# Patient Record
Sex: Male | Born: 1950 | Race: White | Hispanic: No | Marital: Married
Health system: Midwestern US, Academic
[De-identification: ages and names within clinical notes are randomized; demographics above are authoritative.]

---

## 2017-05-04 ENCOUNTER — Encounter: Admit: 2017-05-04 | Discharge: 2017-05-04 | Payer: MEDICARE

## 2017-05-04 DIAGNOSIS — R079 Chest pain, unspecified: Principal | ICD-10-CM

## 2017-05-05 ENCOUNTER — Ambulatory Visit: Admit: 2017-05-05 | Discharge: 2017-05-06 | Payer: MEDICARE

## 2017-05-05 ENCOUNTER — Encounter: Admit: 2017-05-05 | Discharge: 2017-05-05 | Payer: MEDICARE

## 2017-05-05 DIAGNOSIS — I10 Essential (primary) hypertension: Principal | ICD-10-CM

## 2017-05-12 ENCOUNTER — Encounter: Admit: 2017-05-12 | Discharge: 2017-05-12 | Payer: MEDICARE

## 2017-05-12 DIAGNOSIS — R55 Syncope and collapse: Principal | ICD-10-CM

## 2017-05-12 DIAGNOSIS — I10 Essential (primary) hypertension: ICD-10-CM

## 2017-05-17 ENCOUNTER — Encounter: Admit: 2017-05-17 | Discharge: 2017-05-17 | Payer: MEDICARE

## 2017-05-27 ENCOUNTER — Encounter: Admit: 2017-05-27 | Discharge: 2017-05-27 | Payer: MEDICARE

## 2017-05-27 ENCOUNTER — Ambulatory Visit: Admit: 2017-05-27 | Discharge: 2017-05-28 | Payer: MEDICARE

## 2017-05-27 DIAGNOSIS — R9439 Abnormal result of other cardiovascular function study: ICD-10-CM

## 2017-05-27 DIAGNOSIS — I10 Essential (primary) hypertension: ICD-10-CM

## 2017-05-27 DIAGNOSIS — R079 Chest pain, unspecified: Principal | ICD-10-CM

## 2017-05-27 DIAGNOSIS — E6609 Other obesity due to excess calories: ICD-10-CM

## 2017-05-27 DIAGNOSIS — R0602 Shortness of breath: ICD-10-CM

## 2017-05-27 DIAGNOSIS — G4733 Obstructive sleep apnea (adult) (pediatric): ICD-10-CM

## 2017-05-27 DIAGNOSIS — R0600 Dyspnea, unspecified: Secondary | ICD-10-CM

## 2017-05-27 DIAGNOSIS — I4892 Unspecified atrial flutter: ICD-10-CM

## 2017-05-27 LAB — CBC
Lab: 12 — ABNORMAL HIGH (ref 0–14)
Lab: 16
Lab: 176
Lab: 30
Lab: 34
Lab: 49
Lab: 5.5
Lab: 8.8
Lab: 88

## 2017-05-27 LAB — BASIC METABOLIC PANEL: Lab: 140

## 2017-05-27 MED ORDER — LIDOCAINE (PF) 10 MG/ML (1 %) IJ SOLN
.1-2 mL | INTRAMUSCULAR | 0 refills | Status: CN | PRN
Start: 2017-05-27 — End: ?

## 2017-05-27 NOTE — Progress Notes
Medicare is listed as patient's primary insurance coverage.  Pre-certification is not required for hospitalizations.

## 2017-05-27 NOTE — Progress Notes
Date of Service: 05/27/2017    Dylan Cox is a 66 y.o. male.       HPI     Mr. think there is a 66 year old white male, he is here today to establish care with [facility].  This patient has been experiencing symptoms of chest pain, shortness of breath and symptomatic heart palpitations that mainly occur with physical activity, they have been intensifying for the past few weeks/few months.    He was evaluated with a myocardial perfusion imaging study on 05/06/2017, the study was interpreted to be mildly abnormal due to basal inferior septal hypokinesis and an ejection fraction of 49%.  The tomographic pattern did not identify any significant myocardial ischemia in the ECG portion of the study was negative for ischemia.    The patient was also evaluated with a 2D echo Doppler study on May 04, 2017 he demonstrated normal left ventricular systolic function, mild concentric LVH, mild mitral vascular sclerosis, no pericardial effusion was identified.    This patient was also evaluated with a Holter monitor performed in [facility], the rhythm was normal sinus without any arrhythmia.    The laboratory work dated 04/22/2017 was remarkable for normal liver enzymes, HDL was 41 mg/dL, LDL was 161 mg/dL, hemoglobin W9U was normal at 5.8%.    Patient's past medical history is significant for sleep apnea, he currently uses CPAP.    A cholesterol profile dated 04/22/2017 demonstrated a total cholesterol of 179 mg/dL, triglycerides 045 mg/dL, HDL 41 mg/dL, LDL of 409 mg/dL.     Social history: This patient does have a history of smoking but he quit tobacco use approximately 15 years ago.  He rarely drinks alcohol.  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    Family history: It is significant for premature coronary artery disease, his father had a stroke and a myocardial infarction before age 37 and a brother also had a myocardial infarction and has carotid artery disease.         Vitals:    05/27/17 1300   BP: 148/88   Pulse: 75   Weight: 128.9 kg (284 lb 3.2 oz)   Height: 1.829 m (6')     Body mass index is 38.54 kg/m???.     Past Medical History  Patient Active Problem List    Diagnosis Date Noted   ??? Shortness of breath 05/17/2017   ??? Hypertension 05/17/2017   ??? Chest pain 05/17/2017     1. Holter - 05/05/17 at [facility] - 1. Normal 24 hour holter revealing NSR and no arrhythmias.       ??? Atrial flutter (HCC) 05/17/2017         Review of Systems   Constitution: Positive for malaise/fatigue.   HENT: Positive for hearing loss and tinnitus.    Eyes: Positive for visual disturbance.   Cardiovascular: Positive for chest pain, dyspnea on exertion and leg swelling.   Respiratory: Positive for shortness of breath, sleep disturbances due to breathing and wheezing.    Endocrine: Negative.    Hematologic/Lymphatic: Negative.    Skin: Negative.    Musculoskeletal: Positive for arthritis.   Gastrointestinal: Negative.    Genitourinary: Positive for incomplete emptying.   Neurological: Negative.    Psychiatric/Behavioral: Negative.    Allergic/Immunologic: Negative.        Physical Exam  General Appearance: normal in appearance  Skin: warm, moist, no ulcers or  xanthomas  Eyes: conjunctivae and lids normal, pupils are equal and round  Lips & Oral Mucosa: no pallor or cyanosis  Neck Veins: neck veins are flat, neck veins are not distended  Chest Inspection: chest is normal in appearance  Respiratory Effort: breathing comfortably, no respiratory distress  Auscultation/Percussion: lungs clear to auscultation, no rales or rhonchi, no wheezing  Cardiac Rhythm: regular rhythm and normal rate  Cardiac Auscultation: S1, S2 normal, no rub, no gallop  Murmurs: no murmur  Carotid Arteries: normal carotid upstroke bilaterally, no bruit  Lower Extremity Edema: no lower extremity edema Abdominal Exam: soft, non-tender, no masses, bowel sounds normal  Liver & Spleen: no organomegaly  Language and Memory: patient responsive and seems to comprehend information  Neurologic Exam: neurological assessment grossly intact        Cardiovascular Studies  Twelve-lead EKG demonstrated normal sinus rhythm, no ST segment T-wave changes    Problems Addressed Today  Encounter Diagnoses   Name Primary?   ??? Chest pain, unspecified type Yes   ??? Shortness of breath    ??? Abnormal thallium stress test    ??? Atrial flutter, unspecified type (HCC)    ??? Essential hypertension    ??? Class 2 obesity due to excess calories without serious comorbidity with body mass index (BMI) of 38.0 to 38.9 in adult        Assessment and Plan     Assessment:    1.  Symptoms of chest pain, shortness of breath and heart palpitations  ??? These typically occur with physical activity and they could be reflective of underlying coronary artery disease    2.  Mildly abnormal myocardial perfusion imaging study dated 05/06/2017  ??? There were wall motion abnormalities compatible with inferior septal hypokinesis, the ejection fraction was 49%  ??? The tomographic pattern did not demonstrate any large areas of jeopardized ischemic myocardium    3.  Obesity  ??? Patient's BMI is 38.54    4.  Hypertension  ??? He is currently treated with carvedilol and a diuretic    5.  Hyperlipidemia  ??? Patient is on simvastatin     6.  Framingham risk score is high more than 30% estimated 10 year global CVD risk  ??? According to the is assessment the estimated vascular age she is more than 80 years    Plan:    1.  In the light of patient's symptoms, risk factors for coronary artery disease, high Framingham risk score and abnormal myocardial perfusion imaging study we will proceed with a left heart catheterization for further definition of the coronary anatomy.  2.  I also recommended a low-fat low-cholesterol, low sugar,  low carbohydrate diet and further risk factors modification.  3.  Follow-up office visit in 4-6 weeks following the procedure.  4.  In the light of high Framingham score I also recommend to aim for an LDL of less than 100 mg/dL, will call the patient and asked him to increase the simvastatin to 40 mg p.o. daily.         Current Medications (including today's revisions)  ??? albuterol (PROAIR HFA, VENTOLIN HFA, OR PROVENTIL HFA) 90 mcg/actuation inhaler Inhale 2 puffs by mouth into the lungs every 6 hours as needed for Wheezing or Shortness of Breath. Shake well before use.   ??? aspirin EC 325 mg tablet Take 325 mg by mouth daily. Take with food.   ??? carvedilol (COREG) 6.25 mg tablet Take 6.25 mg by mouth twice daily with meals.  Take with food.   ??? diclofenac sodium 2 % sopk Apply  topically to affected area.   ??? gabapentin (NEURONTIN) 300 mg capsule Take 300 mg by mouth every 8 hours.   ??? meloxicam (MOBIC) 15 mg tablet Take 15 mg by mouth daily.   ??? omeprazole DR(+) (PRILOSEC) 20 mg capsule Take 20 mg by mouth daily before breakfast.   ??? simvastatin (ZOCOR) 20 mg tablet Take 20 mg by mouth at bedtime daily.   ??? triamterene-hydrochlorothiazide (DYAZIDE) 37.5-25 mg capsule Take 1 capsule by mouth every morning.

## 2017-05-28 ENCOUNTER — Encounter: Admit: 2017-05-28 | Discharge: 2017-05-28 | Payer: MEDICARE

## 2017-05-28 DIAGNOSIS — R079 Chest pain, unspecified: Principal | ICD-10-CM

## 2017-05-28 DIAGNOSIS — R9439 Abnormal result of other cardiovascular function study: Principal | ICD-10-CM

## 2017-05-28 DIAGNOSIS — R0602 Shortness of breath: ICD-10-CM

## 2017-05-28 MED ORDER — TEMAZEPAM 15 MG PO CAP
15 mg | Freq: Every evening | ORAL | 0 refills | Status: CN | PRN
Start: 2017-05-28 — End: ?

## 2017-05-28 MED ORDER — ALUMINUM-MAGNESIUM HYDROXIDE 200-200 MG/5 ML PO SUSP
30 mL | ORAL | 0 refills | Status: CN | PRN
Start: 2017-05-28 — End: ?

## 2017-05-28 MED ORDER — ACETAMINOPHEN 325 MG PO TAB
650 mg | ORAL | 0 refills | Status: CN | PRN
Start: 2017-05-28 — End: ?

## 2017-05-28 MED ORDER — MAGNESIUM HYDROXIDE 2,400 MG/10 ML PO SUSP
10 mL | ORAL | 0 refills | Status: CN | PRN
Start: 2017-05-28 — End: ?

## 2017-05-28 MED ORDER — SODIUM CHLORIDE 0.9 % IV SOLP
INTRAVENOUS | 0 refills | Status: CN
Start: 2017-05-28 — End: ?

## 2017-05-28 NOTE — H&P (View-Only)
Patient presents for procedure. Please see most recent H/P May 27, 2017 below.     Elinor Parkinson, APRN-C  Pager [phone]     _____________________________________________________________________________    Office Visit     05/27/2017  [facility] at [facility], [place]   Cardiology   Chest pain, unspecified type +6 more   Dx   Cardiac Eval , Hypertension; Referred by Lona Kettle, MD   Reason for Visit    Progress Notes   Nickolas Madrid, MD (Physician) ??? ??? Cardiology ??? ??? 05/27/17 1315 ??? ??? Signed      Date of Service: 05/27/2017  ???  Dylan Cox is a 66 y.o. male.     ???  HPI  Mr. think there is a 66 year old white male, he is here today to establish care with [facility].  This patient has been experiencing symptoms of chest pain, shortness of breath and symptomatic heart palpitations that mainly occur with physical activity, they have been intensifying for the past few weeks/few months.  ???  He was evaluated with a myocardial perfusion imaging study on 05/06/2017, the study was interpreted to be mildly abnormal due to basal inferior septal hypokinesis and an ejection fraction of 49%.  The tomographic pattern did not identify any significant myocardial ischemia in the ECG portion of the study was negative for ischemia.  ???  The patient was also evaluated with a 2D echo Doppler study on May 04, 2017 he demonstrated normal left ventricular systolic function, mild concentric LVH, mild mitral vascular sclerosis, no pericardial effusion was identified.  ???  This patient was also evaluated with a Holter monitor performed in [facility], the rhythm was normal sinus without any arrhythmia.  ???  The laboratory work dated 04/22/2017 was remarkable for normal liver enzymes, HDL was 41 mg/dL, LDL was 960 mg/dL, hemoglobin A5W was normal at 5.8%.  ???  Patient's past medical history is significant for sleep apnea, he currently uses CPAP.  ??? A cholesterol profile dated 04/22/2017 demonstrated a total cholesterol of 179 mg/dL, triglycerides 098 mg/dL, HDL 41 mg/dL, LDL of 119 mg/dL.   ???  Social history: This patient does have a history of smoking but he quit tobacco use approximately 15 years ago.  He rarely drinks alcohol.  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  ???  Family history: It is significant for premature coronary artery disease, his father had a stroke and a myocardial infarction before age 47 and a brother also had a myocardial infarction and has carotid artery disease.  ???  ???  Vitals:   ??? 05/27/17 1300   BP: 148/88   Pulse: 75   Weight: 128.9 kg (284 lb 3.2 oz)   Height: 1.829 m (6')   ???  Body mass index is 38.54 kg/m???.   ???  Past Medical History        Patient Active Problem List   ??? Diagnosis Date Noted   ??? Shortness of breath 05/17/2017   ??? Hypertension 05/17/2017   ??? Chest pain 05/17/2017   ??? ??? 1. Holter - 05/05/17 at [facility] - 1. Normal 24 hour holter revealing NSR and no arrhythmias.    ???   ??? Atrial flutter (HCC) 05/17/2017   ???  ???  No Known Allergies  Family History   Problem Relation Age of Onset   ??? Heart Attack Father    ???  Stroke Father    ??? Heart Attack Brother      No past surgical history on file.  Social History     Social History   ??? Marital status: Married     Spouse name: N/A   ??? Number of children: N/A   ??? Years of education: N/A     Social History Main Topics   ??? Smoking status: Former Smoker     Quit date: 05/27/2002   ??? Smokeless tobacco: Never Used   ??? Alcohol use Yes      Comment: Occasional   ??? Drug use: No   ??? Sexual activity: Not on file     Other Topics Concern   ??? Not on file     Social History Narrative   ??? No narrative on file       ???  Review of Systems   Constitution: Positive for malaise/fatigue.   HENT: Positive for hearing loss and tinnitus.    Eyes: Positive for visual disturbance. Cardiovascular: Positive for chest pain, dyspnea on exertion and leg swelling.   Respiratory: Positive for shortness of breath, sleep disturbances due to breathing and wheezing.    Endocrine: Negative.    Hematologic/Lymphatic: Negative.    Skin: Negative.    Musculoskeletal: Positive for arthritis.   Gastrointestinal: Negative.    Genitourinary: Positive for incomplete emptying.   Neurological: Negative.    Psychiatric/Behavioral: Negative.    Allergic/Immunologic: Negative.    ???  ???  Physical Exam  General Appearance: normal in appearance  Skin: warm, moist, no ulcers or xanthomas  Eyes: conjunctivae and lids normal, pupils are equal and round  Lips & Oral Mucosa: no pallor or cyanosis  Neck Veins: neck veins are flat, neck veins are not distended  Chest Inspection: chest is normal in appearance  Respiratory Effort: breathing comfortably, no respiratory distress  Auscultation/Percussion: lungs clear to auscultation, no rales or rhonchi, no wheezing  Cardiac Rhythm: regular rhythm and normal rate  Cardiac Auscultation: S1, S2 normal, no rub, no gallop  Murmurs: no murmur  Carotid Arteries: normal carotid upstroke bilaterally, no bruit  Lower Extremity Edema: no lower extremity edema  Abdominal Exam: soft, non-tender, no masses, bowel sounds normal  Liver & Spleen: no organomegaly  Language and Memory: patient responsive and seems to comprehend information  Neurologic Exam: neurological assessment grossly intact  ???  ???  ???  Cardiovascular Studies  Twelve-lead EKG demonstrated normal sinus rhythm, no ST segment T-wave changes  ???  Problems Addressed Today       Encounter Diagnoses   Name Primary?   ??? Chest pain, unspecified type Yes   ??? Shortness of breath ???   ??? Abnormal thallium stress test ???   ??? Atrial flutter, unspecified type (HCC) ???   ??? Essential hypertension ???   ??? Class 2 obesity due to excess calories without serious comorbidity with body mass index (BMI) of 38.0 to 38.9 in adult ???   ???  ???  Assessment and Plan Assessment:  ???  1.  Symptoms of chest pain, shortness of breath and heart palpitations  ??? These typically occur with physical activity and they could be reflective of underlying coronary artery disease  ???  2.  Mildly abnormal myocardial perfusion imaging study dated 05/06/2017  ??? There were wall motion abnormalities compatible with inferior septal hypokinesis, the ejection fraction was 49%  ??? The tomographic pattern did not demonstrate any large areas of jeopardized ischemic myocardium  ???  3.  Obesity  ???  Patient's BMI is 38.54  ???  4.  Hypertension  ??? He is currently treated with carvedilol and a diuretic  ???  5.  Hyperlipidemia  ??? Patient is on simvastatin   ???  6.  Framingham risk score is high more than 30% estimated 10 year global CVD risk  ??? According to the is assessment the estimated vascular age she is more than 80 years  ???  Plan:  ???  1.  In the light of patient's symptoms, risk factors for coronary artery disease, high Framingham risk score and abnormal myocardial perfusion imaging study we will proceed with a left heart catheterization for further definition of the coronary anatomy.  2.  I also recommended a low-fat low-cholesterol, low sugar,  low carbohydrate diet and further risk factors modification.  3.  Follow-up office visit in 4-6 weeks following the procedure.  4.  In the light of high Framingham score I also recommend to aim for an LDL of less than 100 mg/dL, will call the patient and asked him to increase the simvastatin to 40 mg p.o. daily.  ???  ???  Current Medications (including today's revisions)  ??? albuterol (PROAIR HFA, VENTOLIN HFA, OR PROVENTIL HFA) 90 mcg/actuation inhaler Inhale 2 puffs by mouth into the lungs every 6 hours as needed for Wheezing or Shortness of Breath. Shake well before use.   ??? aspirin EC 325 mg tablet Take 325 mg by mouth daily. Take with food.   ??? carvedilol (COREG) 6.25 mg tablet Take 6.25 mg by mouth twice daily with meals. Take with food. ??? diclofenac sodium 2 % sopk Apply  topically to affected area.   ??? gabapentin (NEURONTIN) 300 mg capsule Take 300 mg by mouth every 8 hours.   ??? meloxicam (MOBIC) 15 mg tablet Take 15 mg by mouth daily.   ??? omeprazole DR(+) (PRILOSEC) 20 mg capsule Take 20 mg by mouth daily before breakfast.   ??? simvastatin (ZOCOR) 20 mg tablet Take 20 mg by mouth at bedtime daily.   ??? triamterene-hydrochlorothiazide (DYAZIDE) 37.5-25 mg capsule Take 1 capsule by mouth every morning.   ???

## 2017-06-01 ENCOUNTER — Encounter: Admit: 2017-06-01 | Discharge: 2017-06-01 | Payer: MEDICARE

## 2017-06-01 MED ORDER — SIMVASTATIN 40 MG PO TAB
40 mg | ORAL_TABLET | Freq: Every evening | ORAL | 3 refills | Status: AC
Start: 2017-06-01 — End: 2018-05-23

## 2017-06-02 ENCOUNTER — Encounter: Admit: 2017-06-02 | Discharge: 2017-06-02 | Payer: MEDICARE

## 2017-06-02 DIAGNOSIS — R079 Chest pain, unspecified: Principal | ICD-10-CM

## 2017-06-02 DIAGNOSIS — R0600 Dyspnea, unspecified: ICD-10-CM

## 2017-06-02 DIAGNOSIS — G4733 Obstructive sleep apnea (adult) (pediatric): ICD-10-CM

## 2017-06-02 DIAGNOSIS — I4892 Unspecified atrial flutter: ICD-10-CM

## 2017-06-02 DIAGNOSIS — I25119 Atherosclerotic heart disease of native coronary artery with unspecified angina pectoris: ICD-10-CM

## 2017-06-02 DIAGNOSIS — E6609 Other obesity due to excess calories: ICD-10-CM

## 2017-06-02 DIAGNOSIS — I10 Essential (primary) hypertension: ICD-10-CM

## 2017-06-02 DIAGNOSIS — R9439 Abnormal result of other cardiovascular function study: ICD-10-CM

## 2017-06-02 MED ORDER — TRIAMTERENE-HYDROCHLOROTHIAZID 37.5-25 MG PO TAB
1 | Freq: Every day | ORAL | 0 refills | Status: DC
Start: 2017-06-02 — End: 2017-06-03

## 2017-06-02 MED ORDER — SODIUM CHLORIDE 0.9 % IV SOLP
INTRAVENOUS | 0 refills | Status: DC
Start: 2017-06-02 — End: 2017-06-03
  Administered 2017-06-02: 15:00:00 1000.000 mL via INTRAVENOUS

## 2017-06-02 MED ORDER — ALBUTEROL SULFATE 90 MCG/ACTUATION IN HFAA
2 | RESPIRATORY_TRACT | 0 refills | Status: DC | PRN
Start: 2017-06-02 — End: 2017-06-03

## 2017-06-02 MED ORDER — ATORVASTATIN 40 MG PO TAB
40 mg | Freq: Every evening | ORAL | 0 refills | Status: DC
Start: 2017-06-02 — End: 2017-06-03
  Administered 2017-06-03: 01:00:00 40 mg via ORAL

## 2017-06-02 MED ORDER — DIPHENHYDRAMINE HCL 25 MG PO CAP
25 mg | ORAL | 0 refills | Status: DC | PRN
Start: 2017-06-02 — End: 2017-06-03

## 2017-06-02 MED ORDER — GABAPENTIN 300 MG PO CAP
300 mg | ORAL | 0 refills | Status: DC
Start: 2017-06-02 — End: 2017-06-03
  Administered 2017-06-02 – 2017-06-03 (×2): 300 mg via ORAL

## 2017-06-02 MED ORDER — MAGNESIUM HYDROXIDE 2,400 MG/10 ML PO SUSP
10 mL | ORAL | 0 refills | Status: DC | PRN
Start: 2017-06-02 — End: 2017-06-03

## 2017-06-02 MED ORDER — PANTOPRAZOLE 40 MG PO TBEC
40 mg | Freq: Every day | ORAL | 0 refills | Status: DC
Start: 2017-06-02 — End: 2017-06-03

## 2017-06-02 MED ORDER — ACETAMINOPHEN 325 MG PO TAB
650 mg | ORAL | 0 refills | Status: DC | PRN
Start: 2017-06-02 — End: 2017-06-03
  Administered 2017-06-03 (×2): 650 mg via ORAL

## 2017-06-02 MED ORDER — CLOPIDOGREL 300 MG PO TAB
600 mg | Freq: Once | ORAL | 0 refills | Status: DC
Start: 2017-06-02 — End: 2017-06-03

## 2017-06-02 MED ORDER — DIPHENHYDRAMINE HCL 50 MG/ML IJ SOLN
25 mg | INTRAVENOUS | 0 refills | Status: DC | PRN
Start: 2017-06-02 — End: 2017-06-03

## 2017-06-02 MED ORDER — ASPIRIN 81 MG PO TBEC
81 mg | Freq: Every day | ORAL | 0 refills | Status: DC
Start: 2017-06-02 — End: 2017-06-03

## 2017-06-02 MED ORDER — TEMAZEPAM 15 MG PO CAP
15 mg | Freq: Every evening | ORAL | 0 refills | Status: DC | PRN
Start: 2017-06-02 — End: 2017-06-03

## 2017-06-02 MED ORDER — CLOPIDOGREL 75 MG PO TAB
75 mg | Freq: Every day | ORAL | 0 refills | Status: DC
Start: 2017-06-02 — End: 2017-06-03
  Administered 2017-06-03: 13:00:00 75 mg via ORAL

## 2017-06-02 MED ORDER — ALUMINUM-MAGNESIUM HYDROXIDE 200-200 MG/5 ML PO SUSP
30 mL | ORAL | 0 refills | Status: DC | PRN
Start: 2017-06-02 — End: 2017-06-03

## 2017-06-02 MED ORDER — CARVEDILOL 6.25 MG PO TAB
6.25 mg | Freq: Two times a day (BID) | ORAL | 0 refills | Status: DC
Start: 2017-06-02 — End: 2017-06-03
  Administered 2017-06-03: 01:00:00 6.25 mg via ORAL

## 2017-06-02 MED ORDER — LIDOCAINE (PF) 10 MG/ML (1 %) IJ SOLN
.1-2 mL | INTRAMUSCULAR | 0 refills | Status: DC | PRN
Start: 2017-06-02 — End: 2017-06-03

## 2017-06-02 MED ORDER — ONDANSETRON HCL (PF) 4 MG/2 ML IJ SOLN
4 mg | INTRAVENOUS | 0 refills | Status: DC | PRN
Start: 2017-06-02 — End: 2017-06-03

## 2017-06-02 MED ORDER — SIMVASTATIN 40 MG PO TAB
40 mg | Freq: Every evening | ORAL | 0 refills | Status: CN
Start: 2017-06-02 — End: ?

## 2017-06-02 NOTE — Progress Notes
Patient arrived on unit via ambulation accompanied by family. Patient transferred to the bed with assistance. Frailty score equals 3.  Assessment completed, refer to flowsheet for details. Orders released, reviewed, and implemented as appropriate. Oriented to surroundings, call light within reach. Plan of care reviewed.  Will continue to monitor and assess.

## 2017-06-02 NOTE — Progress Notes
RESPIRATORY THERAPY  ADULT PROTOCOL EVALUATION      RESPIRATORY PROTOCOL PLAN    Medications  Albuterol: MDI PRN    Note: If indicated by protocol, medication orders will be placed by therapist.    Procedures          _____________________________________________________________    PATIENT EVALUATION RESULTS    Chart Review  * Pulmonary Hx: Smoking cessation < 8 weeks OR still smoking OR > 20 pack/yr hx (PEFR)_____ OR occasional use of bronchodilator (AM)    * Surgical Hx: General surgery (cough & sigh not affected)    * Chest X-Ray: Clear OR not available    * PFT/Oxygenation: FEV1, PEFR > 80% predicted OR physically unable to perform OR Pa02 >80 RA OR Sp02 >95% RA      Patient Assessment  * Respiratory Pattern: Regular pattern and rate OR good chest excursion with deep breathing    * Breath Sounds: Clear and able to auscultate bases posteriorly    * Cough / Sputum: Strong, effective cough OR nonproductive    * Mental Status: Alert, oriented, cooperative    * Activity Level: Ambulatory      Priority Index  Total Points: 3 Points  * Priority Index: 1+    PRIORITY INDEX GUIDELINES*  Priority Points   1 0-9 points   2 9-18 points   3 > 18 points   + Pulm Dx or Home Rx   *Higher points indicate higher acuity.      Therapist: Drucilla ChaletPaula Jakalyn Kratky, RT  Date: 06/02/2017      Key  AC = Airway clearance  AM = Aerosolized medication  BA = Bland aerosol  DB&C = Deep breathe & cough  FEV1 = Forced expiratory volume in first second)  IC = Inspiratory capacity  LE = Lung expansion  MDI = Metered dose inhaler  Neb = Nebulizer  O2 = Oxygen  Oxim =Oximetry  PEFR = Peak expiratory flow rate  RRT = Rapid Response Team

## 2017-06-02 NOTE — Discharge Instructions - Pharmacy
Physician Discharge Summary      Name: Dylan Cox  Medical Record Number: [id number]        Account Number:  [id number]  Date Of Birth:  Mar 31, 1951                         Age:  66 years   Admit date:  06/02/2017                     Discharge date:  06/03/2017    Attending Physician:  Dylan Alto, MD              Service: Cardiology-Interventional    Physician Summary completed by: Sharlynn Oliphant, APRN    Reason for hospitalization: chest pain,shortness of breath, palpitations, abnormal stress test > planned cardiac cath     Significant PMH:   Past Medical History:   Diagnosis Date   ??? Abnormal thallium stress test 05/27/2017    Added automatically from request for surgery [id number]   ??? Atrial flutter (HCC) 05/17/2017   ??? Chest pain 05/17/2017    1. Holter - 05/05/17 at [facility] - 1. Normal 24 hour holter revealing NSR and no arrhythmias.     ??? Class 2 obesity due to excess calories without serious comorbidity with body mass index (BMI) of 38.0 to 38.9 in adult 05/27/2017   ??? Coronary artery disease involving native coronary artery of native heart with angina pectoris (HCC) 06/02/2017    06/02/2017 - mildly abnormal stress test. presented for cath. DES x1 to 80-90% Ramus and DES x 1 to 80-90% PDA    ??? Dyspnea 05/27/2017    Added automatically from request for surgery [id number]   ??? Hypertension 05/17/2017   ??? Obstructive sleep apnea syndrome 05/27/2017         Allergies: Patient has no known allergies.    Admission Lab/Radiology studies notable for:   Hematology:    Lab Results   Component Value Date    HGB 15.3 06/03/2017    HCT 45.8 06/03/2017    PLTCT 168 06/03/2017    WBC 7.6 06/03/2017    MCV 88.6 06/03/2017    MCHC 33.5 06/03/2017    MPV 9.4 06/03/2017    RDW 13.8 06/03/2017   , Coagulation:  No results found for: PT, PTT, INR, General Chemistry:    Lab Results   Component Value Date    NA 139 06/03/2017    K 3.6 06/03/2017    CL 105 06/03/2017    GAP 7 06/03/2017    BUN 18 06/03/2017    CR 0.91 06/03/2017 GLU 102 06/03/2017    CA 9.4 06/03/2017    ALBUMIN 3.8 04/22/2017    MG 2.2 04/22/2017    TOTBILI 0.70 04/22/2017   , Enzymes:    Lab Results   Component Value Date    AST 28 04/22/2017    ALT 35 04/22/2017    ALKPHOS 54 04/22/2017    and Lipid Profile:   Lab Results   Component Value Date    CHOL 112 06/03/2017    TRIG 132 06/03/2017    HDL 35 06/03/2017    LDL 57 06/03/2017    VLDL 26 06/03/2017         Brief Hospital Course:   [name] is a 66 year old male with a h/o OSA and using CPAP. He was seen in clinic with complaints of chest pain, palpitations, and shortness of breath.  He was evaluated with a myocardial perfusion imaging study on 05/06/2017, the study was interpreted to be mildly abnormal due to basal inferior septal hypokinesis and an ejection fraction of 49%.  The tomographic pattern did not identify any significant myocardial ischemia in the ECG portion of the study was negative for ischemia. He was also evaluated with a 2D echo Doppler study on May 04, 2017 he demonstrated normal left ventricular systolic function, mild concentric LVH, mild mitral vascular sclerosis, no pericardial effusion was identified. Holter monitor was negative for arrhythmias.     In light of his symptoms and high Framingham risk score as well as abnormal MPI he was referred for cath. His cath showed  vessel coronary atherosclerosis manifested by an 80% to 90% mid intermediate ramus vessel stenosis status post PCI with a 2.25 x 12 Xience drug-eluting stent, and an 80% mid posterior descending artery stenosis status post PCI with a 2.25 x 12 Xience drug-eluting stent. He had 20% to 30% irregularities throughout the mid LAD. He was initiated on Plavix 75 mg daily which he will continue uninterrupted for a minimum of one year. He will remain on ASA 81 mg EC daily for life.       His post procedure course was uncomplicated and he was discharged home in stable condition the following morning. He will follow up with Dr. Avie Arenas in about 6-8 weeks. Upon discharge the patient and family were given post procedure instructions/restrictions as well as written instructions ( see Discharge instructions)    Condition at Discharge: Stable; Right wrist puncture site D/I without bleeding or hematoma. Extremity pink, warm and dry. There is no loss of pulse in affected extremity. Patient ambulatory in the halls, without difficulty, prior to discharge. BP 145/71  - Pulse 64  - Temp 36.5 ???C (97.7 ???F)  - Ht 1.829 m (6')  - Wt 126 kg (277 lb 12.5 oz)  - SpO2 95%  - BMI 37.67 kg/m???       Discharge Diagnoses:      Hospital Problems        Active Problems    * (Principal)Coronary artery disease involving native coronary artery of native heart with angina pectoris (HCC)    Chest pain    Abnormal thallium stress test    Dyspnea    Abnormal stress test          Surgical Procedures: None    Significant Diagnostic Studies and Procedures:   06/02/2017   ASSESSMENT:    1. 2 vessel coronary atherosclerosis manifested by the following:  a. 80% to 90% mid intermediate ramus vessel stenosis status post PCI with a 2.25 x 12 Xience drug-eluting stent.  b. 80% mid posterior descending artery stenosis status post PCI with a 2.25 x 12 Xience drug-eluting stent.  2. 20% to 30% irregularities throughout the mid LAD.  3. Marginally elevated left ventricular end-diastolic pressures at 14-15 mmHg.  ???  PLAN:  We will continue aspirin indefinitely and Plavix for 6 months post intervention.  ???  Consults:  None    Patient Disposition: Home       Patient instructions/medications:     LIPID PROFILE   Standing Status: Future  Standing Exp. Date: 06/02/18   Please call [facility] Scheduling at [phone],  Monday through Friday, between 8 a.m. to 5 p.m. to arrange a 6-8 week follow up appointment with Dr. Avie Arenas     Procedure Specific Activity   *May return to work/school in 2 days.  *  May shower after discharge. *NO lifting, pushing or pulling more than 5 pounds for one week to affected hand. You may use your wrist splint for a reminder!  *NO strenuous activity for 1 week.  *NO driving for 2 days.  *NO baths or swimming for 1 week.  *NO sexual activity for 1 week.     Report These Signs and Symptoms   Please contact your doctor if you have any of the following symptoms: {Chest pain, shortness of breath, lightheadedness, dizziness, near fainting, palpitations, abd pain, back pain, or bleeding.     Questions About Your Stay   For questions or concerns regarding your hospital stay:    - DURING BUSINESS HOURS (8:00 AM - 4:30 PM):    Call [phone] and asked to be transferred to your discharge attending physician.    - AFTER BUSINESS HOURS (4:30 PM - 8:00 AM, on weekends, or holidays):  Call [phone] and ask the operator to page the on-call doctor for the discharge attending physician.   Discharging attending physician: Laney Pastor [[id number]]      Cardiac Diet   Limiting unhealthy fats and cholesterol is the most important step you can take in reducing your risk for cardiovascular disease.  Unhealthy fats include saturated and trans fats.  Monitor your sodium and cholesterol intake.  Restrict your sodium to 2g (grams) or 2000mg  (milligrams) daily, and your cholesterol to 200mg  daily.    If you have questions regarding your diet at home, you may contact a dietitian at [phone].       Incision Care   You may shower within 24 hours  Keep puncture site clean. Do no submerge your affected arm in a hot tub, pool, lake, or tub for one week.   Keep the area clean and dry for one week  If a band aide or dressing is still in place, remove it before showering.   Do not use ointments, creams, or powders on puncture site.     Return Appointment   Please call [facility] Scheduling at [phone],  Monday through Friday, between 8 a.m. to 5 p.m. to arrange a 6-8 week follow up appointment with Dr. Avie Arenas [facility] [place], [place] [[id number]]      Cardiac Rehab   Your physician has referred you to outpatient cardiac rehab. Contact [facility] Cardiac Rehab Department at [phone] to schedule an appointment.     Request for Cardiology Appointment   Standing Status: Future  Standing Exp. Date: 06/02/22   Scheduling Priority: Routine    Schedule OV with (1st choice Provider) Nickolas Madrid M.D.         Current Discharge Medication List       START taking these medications    Details   clopiDOGrel (PLAVIX) 75 mg tablet Take 1 tablet by mouth daily.  Qty: 90 tablet, Refills: 3    PRESCRIPTION TYPE:  Normal  Associated Diagnoses: Coronary artery disease involving native coronary artery of native heart with angina pectoris (HCC)          CONTINUE these medications which have been CHANGED or REFILLED    Details   aspirin EC 81 mg tablet Take 1 tablet by mouth daily. Take with food.  Qty: 90 tablet, Refills: 3    PRESCRIPTION TYPE:  OTC  Associated Diagnoses: Coronary artery disease involving native coronary artery of native heart with angina pectoris (HCC)          CONTINUE these medications which  have NOT CHANGED    Details   albuterol (PROAIR HFA, VENTOLIN HFA, OR PROVENTIL HFA) 90 mcg/actuation inhaler Inhale 2 puffs by mouth into the lungs every 6 hours as needed for Wheezing or Shortness of Breath. Shake well before use.    PRESCRIPTION TYPE:  Historical Med      carvedilol (COREG) 6.25 mg tablet Take 6.25 mg by mouth twice daily with meals. Take with food.    PRESCRIPTION TYPE:  Historical Med      diclofenac sodium 2 % sopk Apply  topically to affected area.    PRESCRIPTION TYPE:  Historical Med      gabapentin (NEURONTIN) 300 mg capsule Take 300 mg by mouth every 8 hours.    PRESCRIPTION TYPE:  Historical Med      omeprazole DR(+) (PRILOSEC) 20 mg capsule Take 20 mg by mouth daily before breakfast.    PRESCRIPTION TYPE:  Historical Med simvastatin (ZOCOR) 40 mg tablet Take 1 tablet by mouth at bedtime daily.  Qty: 90 tablet, Refills: 3    PRESCRIPTION TYPE:  Normal      triamterene-hydrochlorothiazide (DYAZIDE) 37.5-25 mg capsule Take 1 capsule by mouth every morning.    PRESCRIPTION TYPE:  Historical Med          The following medications were removed from your list. This list includes medications discontinued this stay and those removed from your prior med list in our system        meloxicam (MOBIC) 15 mg tablet                   Pending items needing follow up: none     Signed:  Sharlynn Oliphant, APRN  06/03/2017      cc:  Primary Care Physician:  Lona Kettle   Verified  Referring physicians:  Lona Kettle, MD   Additional provider(s):

## 2017-06-03 ENCOUNTER — Encounter: Admit: 2017-06-02 | Discharge: 2017-06-03 | Payer: MEDICARE

## 2017-06-03 DIAGNOSIS — E669 Obesity, unspecified: ICD-10-CM

## 2017-06-03 DIAGNOSIS — I10 Essential (primary) hypertension: ICD-10-CM

## 2017-06-03 DIAGNOSIS — R931 Abnormal findings on diagnostic imaging of heart and coronary circulation: ICD-10-CM

## 2017-06-03 DIAGNOSIS — E785 Hyperlipidemia, unspecified: ICD-10-CM

## 2017-06-03 DIAGNOSIS — Z87891 Personal history of nicotine dependence: ICD-10-CM

## 2017-06-03 DIAGNOSIS — I2510 Atherosclerotic heart disease of native coronary artery without angina pectoris: Principal | ICD-10-CM

## 2017-06-03 DIAGNOSIS — Z8249 Family history of ischemic heart disease and other diseases of the circulatory system: ICD-10-CM

## 2017-06-03 DIAGNOSIS — Z6838 Body mass index (BMI) 38.0-38.9, adult: ICD-10-CM

## 2017-06-03 LAB — LIPID PROFILE
Lab: 112 mg/dL (ref <200)
Lab: 132 mg/dL (ref <150)
Lab: 26 mg/dL
Lab: 35 mg/dL — ABNORMAL LOW (ref >40)
Lab: 57 mg/dL (ref <100)
Lab: 77 mg/dL

## 2017-06-03 LAB — COMPREHENSIVE METABOLIC PANEL
Lab: 0.7
Lab: 0.9
Lab: 103
Lab: 104
Lab: 109
Lab: 12
Lab: 23
Lab: 28
Lab: 28
Lab: 3.8
Lab: 35
Lab: 54
Lab: 7.2
Lab: 89
Lab: 9.2

## 2017-06-03 LAB — POC ACTIVATED CLOTTING TIME
Lab: 252 s
Lab: 257 s
Lab: 303 s

## 2017-06-03 LAB — BASIC METABOLIC PANEL: Lab: 139 MMOL/L — ABNORMAL LOW (ref 137–147)

## 2017-06-03 LAB — CBC: Lab: 7.6 K/UL — ABNORMAL HIGH (ref 4.5–11.0)

## 2017-06-03 MED ORDER — ASPIRIN 81 MG PO TBEC
81 mg | ORAL_TABLET | Freq: Every day | ORAL | 3 refills | Status: AC
Start: 2017-06-03 — End: ?

## 2017-06-03 MED ORDER — CLOPIDOGREL 75 MG PO TAB
75 mg | ORAL_TABLET | Freq: Every day | ORAL | 3 refills | 90.00000 days | Status: AC
Start: 2017-06-03 — End: 2018-05-23

## 2017-06-03 NOTE — Progress Notes
CARDIOPULMONARY REHABILITATION  INPATIENT ASSESSMENT    Cardiac Rehabilitation Staff: Mardene Sayer, RN Discharge Date:     Demographics  Pre-admit Dx: Chest Pain Date of Admission: 06/02/2017     Room: [facility] CVLAB RM/HC2 CVLAB BD DOB:  1951-06-08   Insurance: Primary: Medicare  Secondary: uknown   Address: [address]  [place] [address]   Patient Phone:  [phone] (home)    Marital Status: Married  Occupation: Unknown   ED Contact: Beulah Gandy  ED Phone #: [phone]   CTS: NA  Cardiologist: Hannen     Cardiac Procedures and Events         PCI: 06/02/17                   Risk Factors  Risk Factors: Hypertension, Obesity, Hyperlipidemia, Family history  BP: (!) 156/91  Height: 182.9 cm (72)  Weight: 126 kg (277 lb 12.5 oz)  BMI (Calculated): 37.67      Medical History   has a past medical history of Abnormal thallium stress test (05/27/2017); Atrial flutter (HCC) (05/17/2017); Chest pain (05/17/2017); Class 2 obesity due to excess calories without serious comorbidity with body mass index (BMI) of 38.0 to 38.9 in adult (05/27/2017); Coronary artery disease involving native coronary artery of native heart with angina pectoris (HCC) (06/02/2017); Dyspnea (05/27/2017); Hypertension (05/17/2017); and Obstructive sleep apnea syndrome (05/27/2017).    Labs  Cholesterol   Date Value Ref Range Status   06/03/2017 112 <200 MG/DL Final     Triglycerides   Date Value Ref Range Status   06/03/2017 132 <150 MG/DL Final     HDL   Date Value Ref Range Status   06/03/2017 35 (L) >40 MG/DL Final     LDL   Date Value Ref Range Status   06/03/2017 57 <100 MG/DL Final     Hemoglobin Q0H   Date Value Ref Range Status   04/22/2017 5.8  Final         Heart Resource Manual Given: 06/03/17     Teaching Completed: 06/03/17     Outpatient Cardiopulmonary Rehabilitation    OPCR: Yes    Referral Faxed to:   Marge Duncans, [place]   Date Faxed:  (will fax once pt is d/c)    Location: [place],     If [place], Sent to Staff: Junious Dresser, RN  06/03/2017

## 2017-06-03 NOTE — Progress Notes
Patient discharged to home with all belongings.  Discharge instructions, med reconciliation and home wound care instructions given and explained to patient and family both verbally and written.  Accompanied by trasnport.  No complaints of pain or discomfort.   Right radial puncture site  remains clean, dry, and intact with no evidence of a hematoma or bleeding after ambulation.  Patient escorted to lobby via Transport.  Patient to follow up with [facility]) or on-call physician with any additional questions or concerns.  All contact numbers provided.  Patient and family acceptant of DC instuctions and report understanding to all information.

## 2017-06-11 NOTE — Progress Notes
Cardiac Rehab Call Back Note:unable to contact pt, sent letter.    Are you tolerating activity?N/A  Is pain controlled?N/A  Is appetite normal?N/A  Are you having symptoms of heart discomfort?N/A  Are you having signs of infection at your incision sites or groin site?N/A  Do you want outpt cardiac rehab?N/A

## 2017-06-11 NOTE — Progress Notes
Cardiac Rehab Call Back Note:  Left message for patient to call back.    Are you tolerating activity?N/A  Is pain controlled?N/A  Is appetite normal?N/A  Are you having symptoms of heart discomfort?N/A  Are you having signs of infection at your incision sites or groin site?N/A  Do you want outpt cardiac rehab?N/A

## 2017-06-16 NOTE — Progress Notes
Cardiac Rehab Call Back Note:  pt reporting he is doing great. Pt reporting he has started cardiac rehab.    Are you tolerating activity?N/A  Is pain controlled?Pt reporting muscle pain related to starting cardiac rehab  Is appetite normal?N/A  Are you having symptoms of heart discomfort?Pt reporting intermittent discomfort on the left side. Pt reporting he thinks it is muscle pain. Reminded pt that if he is having chest pain, he needs to contact his doctor. Pt verbalized understanding.  Are you having signs of infection at your incision sites or groin site?No  Do you want outpt cardiac rehab?Yes

## 2017-07-06 ENCOUNTER — Encounter: Admit: 2017-07-06 | Discharge: 2017-07-06 | Payer: MEDICARE

## 2017-07-14 ENCOUNTER — Encounter: Admit: 2017-07-14 | Discharge: 2017-07-14 | Payer: MEDICARE

## 2017-07-14 DIAGNOSIS — I25119 Atherosclerotic heart disease of native coronary artery with unspecified angina pectoris: Principal | ICD-10-CM

## 2017-07-14 LAB — LIPID PROFILE
Lab: 114
Lab: 131 — ABNORMAL LOW (ref 150–200)
Lab: 64
Lab: 23
Lab: 3
Lab: 50

## 2017-07-22 ENCOUNTER — Encounter: Admit: 2017-07-22 | Discharge: 2017-07-22 | Payer: MEDICARE

## 2017-07-22 ENCOUNTER — Ambulatory Visit: Admit: 2017-07-22 | Discharge: 2017-07-23 | Payer: MEDICARE

## 2017-07-22 DIAGNOSIS — G4733 Obstructive sleep apnea (adult) (pediatric): ICD-10-CM

## 2017-07-22 DIAGNOSIS — E6609 Other obesity due to excess calories: ICD-10-CM

## 2017-07-22 DIAGNOSIS — R9439 Abnormal result of other cardiovascular function study: ICD-10-CM

## 2017-07-22 DIAGNOSIS — I10 Essential (primary) hypertension: ICD-10-CM

## 2017-07-22 DIAGNOSIS — R079 Chest pain, unspecified: Principal | ICD-10-CM

## 2017-07-22 DIAGNOSIS — I4892 Unspecified atrial flutter: ICD-10-CM

## 2017-07-22 DIAGNOSIS — I25119 Atherosclerotic heart disease of native coronary artery with unspecified angina pectoris: Principal | ICD-10-CM

## 2017-07-22 DIAGNOSIS — R0602 Shortness of breath: ICD-10-CM

## 2017-07-22 DIAGNOSIS — R0600 Dyspnea, unspecified: ICD-10-CM

## 2017-07-22 MED ORDER — MELOXICAM 15 MG PO TAB
15 mg | ORAL_TABLET | Freq: Every day | ORAL | 3 refills | 30.00000 days | Status: AC
Start: 2017-07-22 — End: 2018-08-18

## 2017-07-22 NOTE — Progress Notes
Date of Service: 07/22/2017    Dylan Cox is a 66 y.o. male.       HPI     Patient is here today for a follow-up office visit.  He was previously seen in our office on May 27, 2014, prior to that patient had a history of symptoms of chest pain, symptomatic heart palpitations and shortness of breath.  He was evaluated with a myocardial perfusion imaging study on May 06, 2017, the tomographic pattern did not reveal any significant ischemia, however the ejection fraction was 49% and hypokinesis of the basal inferior septal wall was present.    Based on his symptoms, risk factors for coronary artery disease and high Framingham score this patient was scheduled for a left heart catheterization.    This procedure was performed on June 02, 2017, it resulted in PCI of the intermediate ramus and mid posterior descending artery with 2 drug-eluting stents.  Patient was able to tolerate this procedure very well, it was uneventful and he was discharged from the hospital in stable condition.    Today patient does not report any further symptoms of chest pain, increased shortness of breath or heart palpitations.  He has felt very well since undergoing the coronary intervention.  Patient has not taken any sublingual nitroglycerin.  He continues to remain on dual antiplatelet therapy.    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         Vitals:    07/22/17 0928 07/22/17 0934   BP: 136/82 142/78   Pulse: 82    Weight: 128.3 kg (282 lb 12.8 oz)    Height: 1.829 m (6')      Body mass index is 38.35 kg/m???.     Past Medical History  Patient Active Problem List    Diagnosis Date Noted   ??? Abnormal stress test 06/03/2017   ??? Coronary artery disease involving native coronary artery of native heart with angina pectoris (HCC) 06/02/2017     06/02/2017 - mildly abnormal stress test. presented for cath. DES x1 to 80-90% Ramus and DES x 1 to 80-90% PDA      ??? Abnormal thallium stress test 05/27/2017     Added automatically from request for surgery [id number]     ??? Dyspnea 05/27/2017     Added automatically from request for surgery [phone]     ??? Class 2 obesity due to excess calories without serious comorbidity with body mass index (BMI) of 38.0 to 38.9 in adult 05/27/2017   ??? Obstructive sleep apnea syndrome 05/27/2017   ??? Shortness of breath 05/17/2017   ??? Hypertension 05/17/2017   ??? Chest pain 05/17/2017     1. Holter - 05/05/17 at [facility] - 1. Normal 24 hour holter revealing NSR and no arrhythmias.       ??? Atrial flutter (HCC) 05/17/2017         Review of Systems   Constitution: Negative.   HENT: Positive for tinnitus.    Eyes: Positive for vision loss in right eye.   Cardiovascular: Negative.    Respiratory: Negative.    Endocrine: Negative.    Hematologic/Lymphatic: Negative.    Skin: Negative.    Musculoskeletal: Positive for arthritis and back pain.   Gastrointestinal: Negative.    Genitourinary: Negative.    Neurological: Positive for vertigo.   Psychiatric/Behavioral: Negative.    Allergic/Immunologic: Negative.  Physical Exam  General Appearance: obese  Skin: warm, moist, no ulcers or xanthomas  Eyes: conjunctivae and lids normal, pupils are equal and round  Lips & Oral Mucosa: no pallor or cyanosis  Neck Veins: neck veins are flat, neck veins are not distended  Chest Inspection: chest is normal in appearance  Respiratory Effort: breathing comfortably, no respiratory distress  Auscultation/Percussion: lungs clear to auscultation, no rales or rhonchi, no wheezing  Cardiac Rhythm: regular rhythm and normal rate  Cardiac Auscultation: S1, S2 normal, no rub, no gallop  Murmurs: no murmur  Carotid Arteries: normal carotid upstroke bilaterally, no bruit  Abdominal aorta: could not be examined due to obese adomen  Lower Extremity Edema: no lower extremity edema Abdominal Exam: soft, non-tender, no masses, bowel sounds normal  Liver & Spleen: no organomegaly  Language and Memory: patient responsive and seems to comprehend information  Neurologic Exam: neurological assessment grossly intact        Cardiovascular Studies      Problems Addressed Today  Encounter Diagnoses   Name Primary?   ??? Abnormal stress test Yes   ??? Coronary artery disease involving native coronary artery of native heart with angina pectoris (HCC)    ??? Essential hypertension    ??? Class 2 obesity due to excess calories without serious comorbidity with body mass index (BMI) of 38.0 to 38.9 in adult    ??? Shortness of breath    ??? Obstructive sleep apnea syndrome        Assessment and Plan     Assessment:    1.  Stable ischemic heart disease  ??? This patient has a history of chest pain and mildly abnormal myocardial perfusion imaging study  ??? He underwent PCI of the ramus intermedius and mid PDA with 2 drug-eluting stents on 06/02/2017  ??? Patient remained stable with without any recurrent symptoms of angina, increased shortness of breath or heart palpitations  ??? Patient is currently on dual antiplatelet therapy and he will continue on this for approximately 1 year.      2.  Hypertension  ??? The blood pressure today in the office was borderline elevated    3. Osteoarthritis  ??? Patient reports many involvement of the lower back  ??? He anticipates to undergo an epidural shot by the end of this year    4.  Hyperlipidemia???patient is on statin therapy    5.  High Framingham risk score, it is higher than 30% estimated 10 year global CVD risk  ??? According to this assessment the estimated vascular age is actually higher than 80 years    Plan:    1.  Continue all current medications and continue dual antiplatelet therapy  2.  I did advise the patient on weight reduction and overall risk factors modification  3.  If he has sleep apnea and is not currently fitted with a CPAP machine, then I recommend further evaluation with a sleep study  4.  Follow-up office visit with me in December 2018, at that time patient will undergo an evaluation, and will decide whether it would be safe for him to temporarily discontinue the dual antiplatelet therapy in order to proceed with an epidural injection.    I appreciate the opportunity of being part of this patient's care.         Current Medications (including today's revisions)  ??? acetaminophen (TYLENOL) 500 mg tablet Take 500 mg by mouth every 6 hours as needed for Pain. Max of 4,000 mg of acetaminophen  in 24 hours.   ??? albuterol (PROAIR HFA, VENTOLIN HFA, OR PROVENTIL HFA) 90 mcg/actuation inhaler Inhale 2 puffs by mouth into the lungs every 6 hours as needed for Wheezing or Shortness of Breath. Shake well before use.   ??? aspirin EC 81 mg tablet Take 1 tablet by mouth daily. Take with food.   ??? carvedilol (COREG) 6.25 mg tablet Take 6.25 mg by mouth twice daily with meals. Take with food.   ??? clopiDOGrel (PLAVIX) 75 mg tablet Take 1 tablet by mouth daily.   ??? diclofenac sodium 2 % sopk Apply  topically to affected area.   ??? gabapentin (NEURONTIN) 300 mg capsule Take 300 mg by mouth every 8 hours.   ??? omeprazole DR(+) (PRILOSEC) 20 mg capsule Take 20 mg by mouth daily before breakfast.   ??? simvastatin (ZOCOR) 40 mg tablet Take 1 tablet by mouth at bedtime daily.   ??? triamterene-hydrochlorothiazide (DYAZIDE) 37.5-25 mg capsule Take 1 capsule by mouth every morning.

## 2017-11-11 ENCOUNTER — Ambulatory Visit: Admit: 2017-11-11 | Discharge: 2017-11-12 | Payer: MEDICARE

## 2017-11-11 ENCOUNTER — Encounter: Admit: 2017-11-11 | Discharge: 2017-11-11 | Payer: MEDICARE

## 2017-11-11 DIAGNOSIS — I25119 Atherosclerotic heart disease of native coronary artery with unspecified angina pectoris: Principal | ICD-10-CM

## 2017-11-11 DIAGNOSIS — R079 Chest pain, unspecified: Principal | ICD-10-CM

## 2017-11-11 DIAGNOSIS — I10 Essential (primary) hypertension: ICD-10-CM

## 2017-11-11 DIAGNOSIS — E6609 Other obesity due to excess calories: ICD-10-CM

## 2017-11-11 DIAGNOSIS — R9439 Abnormal result of other cardiovascular function study: ICD-10-CM

## 2017-11-11 DIAGNOSIS — Z01810 Encounter for preprocedural cardiovascular examination: ICD-10-CM

## 2017-11-11 DIAGNOSIS — G4733 Obstructive sleep apnea (adult) (pediatric): ICD-10-CM

## 2017-11-11 DIAGNOSIS — R0602 Shortness of breath: ICD-10-CM

## 2017-11-11 DIAGNOSIS — R0600 Dyspnea, unspecified: ICD-10-CM

## 2017-11-11 DIAGNOSIS — I4892 Unspecified atrial flutter: ICD-10-CM

## 2017-11-11 DIAGNOSIS — Z7901 Long term (current) use of anticoagulants: ICD-10-CM

## 2017-11-11 NOTE — Progress Notes
Date of Service: 11/11/2017    Dylan Cox is a 66 y.o. male.       HPI     Patient is a 66 year old white male with a history of coronary artery disease.  He was evaluated with a myocardial perfusion imaging study in June 2018, the left ventricular study function was mildly depressed, wall motion abnormalities were present.  Based on his symptoms and risk factors for coronary artery disease it was recommended to further proceed with a left heart catheterization.    This was performed at [facility] on 06/02/2017.  It resulted in PCI of the ramus intermedius and mid PDA with 2 drug-eluting stents.  Patient was able to tolerate the procedure very well and he was discharged home in stable condition.    He has not experience symptoms of chest pain, he has not taken sublingual nitroglycerin.  Unfortunately his weight went up by approximately 10 pounds due to dietary indiscretion.    Patient does have osteoarthritis and has been experiencing significant shoulder pain. he will undergo evaluation by Dr. Aundria Rud in the orthopedic department here in [place] and probably surgical intervention will be performed.         Vitals:    11/11/17 0822 11/11/17 0831   BP: 146/86 152/86   Pulse: 78    Weight: 132.7 kg (292 lb 9.6 oz)    Height: 1.829 m (6')      Body mass index is 39.68 kg/m???.     Past Medical History  Patient Active Problem List    Diagnosis Date Noted   ??? Abnormal stress test 06/03/2017   ??? Coronary artery disease involving native coronary artery of native heart with angina pectoris (HCC) 06/02/2017     06/02/2017 - mildly abnormal stress test. presented for cath. DES x1 to 80-90% Ramus and DES x 1 to 80-90% PDA      ??? Abnormal thallium stress test 05/27/2017     Added automatically from request for surgery [id number]     ??? Dyspnea 05/27/2017     Added automatically from request for surgery [id number]     ??? Class 2 obesity due to excess calories without serious comorbidity with body mass index (BMI) of 38.0 to 38.9 in adult 05/27/2017   ??? Obstructive sleep apnea (adult) (pediatric) 05/27/2017   ??? Shortness of breath 05/17/2017   ??? Hypertension 05/17/2017   ??? Chest pain 05/17/2017     1. Holter - 05/05/17 at [facility] - 1. Normal 24 hour holter revealing NSR and no arrhythmias.       ??? Atrial flutter (HCC) 05/17/2017         Review of Systems   Constitution: Positive for weight gain.   HENT: Negative.    Eyes: Negative.    Cardiovascular: Positive for dyspnea on exertion.   Respiratory: Positive for shortness of breath.    Endocrine: Negative.    Hematologic/Lymphatic: Negative.    Skin: Negative.    Musculoskeletal: Positive for back pain and joint pain.   Gastrointestinal: Negative.    Genitourinary: Negative.    Neurological: Negative.    Psychiatric/Behavioral: Negative.    Allergic/Immunologic: Negative.        Physical Exam  General Appearance: obese  Skin: warm, moist, no ulcers or xanthomas  Eyes: conjunctivae and lids normal, pupils are equal and round  Lips & Oral Mucosa: no pallor or cyanosis  Neck Veins: neck veins are flat, neck veins are not distended  Chest Inspection: chest is  normal in appearance  Respiratory Effort: breathing comfortably, no respiratory distress  Auscultation/Percussion: lungs clear to auscultation, no rales or rhonchi, no wheezing  Cardiac Rhythm: regular rhythm and normal rate  Cardiac Auscultation: S1, S2 normal, no rub, no gallop  Murmurs: no murmur  Carotid Arteries: normal carotid upstroke bilaterally, no bruit  Abdominal aorta: could not be examined due to obese adomen  Lower Extremity Edema: no lower extremity edema  Abdominal Exam: soft, non-tender, no masses, bowel sounds normal  Liver & Spleen: no organomegaly  Language and Memory: patient responsive and seems to comprehend information  Neurologic Exam: neurological assessment grossly intact        Cardiovascular Studies      Problems Addressed Today  Encounter Diagnoses   Name Primary? ??? Coronary artery disease involving native coronary artery of native heart with angina pectoris (HCC) Yes   ??? Essential hypertension    ??? Abnormal stress test    ??? Chest pain, unspecified type    ??? Class 2 obesity due to excess calories without serious comorbidity with body mass index (BMI) of 38.0 to 38.9 in adult    ??? Obstructive sleep apnea (adult) (pediatric)    ??? Shortness of breath    ??? Preop cardiovascular exam    ??? On clopidogrel therapy        Assessment and Plan     Assessment:    1.  Preoperative cardiovascular evaluation   ??? It is anticipated the patient will probably undergo shoulder surgery in the near future    2.  Stable ischemic heart disease  ??? Patient is status post PCI of the ramus intermedius and mid PDA with 2 drug-eluting stents on 06/02/2017  ??? Patient has not experienced recurrent symptoms of angina and has not taken sublingual nitroglycerin  ??? He is currently on dual antiplatelet therapy that includes clopidogrel and baby aspirin    3.  Hypertension  ??? Patient's blood pressure is mildly elevated today, this could be due to dietary indiscretion, weight gain and noncompliance with a low-salt diet    4.  Hyperlipidemia  ??? Patient is on statin therapy    5.  Sleep apnea  ??? Patient does use a CPAP machine.    Plan:    1.  From a cardiac standpoint this patient is stable to undergo general anesthesia and orthopedic intervention on his left shoulder for osteoarthritis.  2.  I recommend completion of at least 6 months of dual antiplatelet therapy, this will be on December 03, 2017, following that patient can discontinue clopidogrel 7 days prior to the orthopedic surgical intervention and resume it as soon as considered safe from from a surgical standpoint.  3.  I did advise the patient on weight reduction and low-salt, low carb, low-fat and low sugar diet  4.  Follow-up office visit in 6 months           Current Medications (including today's revisions) ??? acetaminophen (TYLENOL) 500 mg tablet Take 500 mg by mouth every 6 hours as needed for Pain. Max of 4,000 mg of acetaminophen in 24 hours.   ??? albuterol (PROAIR HFA, VENTOLIN HFA, OR PROVENTIL HFA) 90 mcg/actuation inhaler Inhale 2 puffs by mouth into the lungs every 6 hours as needed for Wheezing or Shortness of Breath. Shake well before use.   ??? aspirin EC 81 mg tablet Take 1 tablet by mouth daily. Take with food.   ??? carvedilol (COREG) 6.25 mg tablet Take 6.25 mg by mouth twice daily with  meals. Take with food.   ??? clopiDOGrel (PLAVIX) 75 mg tablet Take 1 tablet by mouth daily.   ??? cyclobenzaprine (FLEXERIL) 10 mg tablet Take 1 tablet by mouth as Needed.   ??? diclofenac sodium 2 % sopk Apply  topically to affected area.   ??? gabapentin (NEURONTIN) 300 mg capsule Take 300 mg by mouth every 8 hours.   ??? HYDROcodone/acetaminophen (NORCO) 5/325 mg tablet Take 1 tablet by mouth as Needed   ??? meloxicam (MOBIC) 15 mg tablet Take one tablet by mouth daily.   ??? omeprazole DR(+) (PRILOSEC) 20 mg capsule Take 20 mg by mouth daily before breakfast.   ??? simvastatin (ZOCOR) 40 mg tablet Take 1 tablet by mouth at bedtime daily.   ??? triamterene-hydrochlorothiazide (DYAZIDE) 37.5-25 mg capsule Take 1 capsule by mouth every morning.

## 2018-02-24 ENCOUNTER — Encounter: Admit: 2018-02-24 | Discharge: 2018-02-24 | Payer: MEDICARE

## 2018-02-24 DIAGNOSIS — R0600 Dyspnea, unspecified: ICD-10-CM

## 2018-02-24 DIAGNOSIS — I25119 Atherosclerotic heart disease of native coronary artery with unspecified angina pectoris: ICD-10-CM

## 2018-02-24 DIAGNOSIS — R079 Chest pain, unspecified: Principal | ICD-10-CM

## 2018-02-24 DIAGNOSIS — E6609 Other obesity due to excess calories: ICD-10-CM

## 2018-02-24 DIAGNOSIS — I4892 Unspecified atrial flutter: ICD-10-CM

## 2018-02-24 DIAGNOSIS — R9439 Abnormal result of other cardiovascular function study: ICD-10-CM

## 2018-02-24 DIAGNOSIS — I10 Essential (primary) hypertension: ICD-10-CM

## 2018-02-24 DIAGNOSIS — G4733 Obstructive sleep apnea (adult) (pediatric): ICD-10-CM

## 2018-05-21 ENCOUNTER — Encounter: Admit: 2018-05-21 | Discharge: 2018-05-21 | Payer: MEDICARE

## 2018-05-21 DIAGNOSIS — I25119 Atherosclerotic heart disease of native coronary artery with unspecified angina pectoris: Principal | ICD-10-CM

## 2018-05-23 ENCOUNTER — Encounter: Admit: 2018-05-23 | Discharge: 2018-05-23 | Payer: MEDICARE

## 2018-05-23 MED ORDER — SIMVASTATIN 40 MG PO TAB
40 mg | ORAL_TABLET | Freq: Every evening | ORAL | 3 refills | Status: AC
Start: 2018-05-23 — End: 2019-05-05

## 2018-05-23 MED ORDER — CLOPIDOGREL 75 MG PO TAB
ORAL_TABLET | Freq: Every day | ORAL | 3 refills | 90.00000 days | Status: AC
Start: 2018-05-23 — End: 2019-01-24

## 2018-06-07 ENCOUNTER — Encounter: Admit: 2018-06-07 | Discharge: 2018-06-07 | Payer: MEDICARE

## 2018-06-07 ENCOUNTER — Ambulatory Visit: Admit: 2018-06-07 | Discharge: 2018-06-08 | Payer: MEDICARE

## 2018-06-07 DIAGNOSIS — I4892 Unspecified atrial flutter: ICD-10-CM

## 2018-06-07 DIAGNOSIS — I25119 Atherosclerotic heart disease of native coronary artery with unspecified angina pectoris: ICD-10-CM

## 2018-06-07 DIAGNOSIS — G4733 Obstructive sleep apnea (adult) (pediatric): ICD-10-CM

## 2018-06-07 DIAGNOSIS — R0600 Dyspnea, unspecified: ICD-10-CM

## 2018-06-07 DIAGNOSIS — R079 Chest pain, unspecified: Principal | ICD-10-CM

## 2018-06-07 DIAGNOSIS — I10 Essential (primary) hypertension: ICD-10-CM

## 2018-06-07 DIAGNOSIS — R9439 Abnormal result of other cardiovascular function study: ICD-10-CM

## 2018-06-07 DIAGNOSIS — E6609 Other obesity due to excess calories: ICD-10-CM

## 2018-06-07 LAB — LIPID PROFILE
Lab: 105
Lab: 122 — ABNORMAL LOW (ref 150–200)
Lab: 21
Lab: 42
Lab: 63

## 2018-06-07 LAB — LIVER FUNCTION PANEL: Lab: 1

## 2018-06-08 ENCOUNTER — Encounter: Admit: 2018-06-08 | Discharge: 2018-06-08 | Payer: MEDICARE

## 2018-06-08 DIAGNOSIS — I25119 Atherosclerotic heart disease of native coronary artery with unspecified angina pectoris: Principal | ICD-10-CM

## 2018-08-18 ENCOUNTER — Encounter: Admit: 2018-08-18 | Discharge: 2018-08-18 | Payer: MEDICARE

## 2018-08-18 MED ORDER — MELOXICAM 15 MG PO TAB
ORAL_TABLET | Freq: Every day | ORAL | 3 refills | 30.00000 days | Status: AC
Start: 2018-08-18 — End: 2019-01-24

## 2019-01-24 ENCOUNTER — Ambulatory Visit: Admit: 2019-01-24 | Discharge: 2019-01-24 | Payer: MEDICARE

## 2019-01-24 ENCOUNTER — Encounter: Admit: 2019-01-24 | Discharge: 2019-01-24 | Payer: MEDICARE

## 2019-01-24 DIAGNOSIS — R079 Chest pain, unspecified: Principal | ICD-10-CM

## 2019-01-24 DIAGNOSIS — R9439 Abnormal result of other cardiovascular function study: ICD-10-CM

## 2019-01-24 DIAGNOSIS — R635 Abnormal weight gain: ICD-10-CM

## 2019-01-24 DIAGNOSIS — I4892 Unspecified atrial flutter: ICD-10-CM

## 2019-01-24 DIAGNOSIS — I10 Essential (primary) hypertension: ICD-10-CM

## 2019-01-24 DIAGNOSIS — Z789 Other specified health status: ICD-10-CM

## 2019-01-24 DIAGNOSIS — I25119 Atherosclerotic heart disease of native coronary artery with unspecified angina pectoris: ICD-10-CM

## 2019-01-24 DIAGNOSIS — R0602 Shortness of breath: ICD-10-CM

## 2019-01-24 DIAGNOSIS — E6609 Other obesity due to excess calories: ICD-10-CM

## 2019-01-24 DIAGNOSIS — E7800 Pure hypercholesterolemia, unspecified: Principal | ICD-10-CM

## 2019-01-24 DIAGNOSIS — R0600 Dyspnea, unspecified: ICD-10-CM

## 2019-01-24 DIAGNOSIS — G4733 Obstructive sleep apnea (adult) (pediatric): ICD-10-CM

## 2019-01-24 MED ORDER — NITROGLYCERIN 0.4 MG SL SUBL
ORAL_TABLET | SUBLINGUAL | 3 refills | 9.00000 days | Status: AC | PRN
Start: 2019-01-24 — End: ?

## 2019-01-24 NOTE — Progress Notes
Name Primary?   ??? Pure hypercholesterolemia Yes   ??? Coronary artery disease involving native coronary artery of native heart with angina pectoris (HCC)    ??? Essential hypertension    ??? Chest pain, unspecified type    ??? Class 2 obesity due to excess calories without serious comorbidity with body mass index (BMI) of 38.0 to 38.9 in adult    ??? OSA (obstructive sleep apnea)    ??? Shortness of breath    ??? Weight gain    ??? Weight loss advised        Assessment and Plan:    In summary: This is a 68 year old white male with a history of coronary artery disease, he underwent PCI of the ramus intermedius and PDA on 06/02/2017, patient did gain 17 pounds since July 2018 due to dietary indiscretion, he also has hypertension, hyperlipidemia and sleep apnea (the last sleep study was performed 5 years ago).    Plan:    1.  Continue all current medications  2.  I did recommend weight reduction and weight watchers program  3.  Also recommended a low-fat, low-cholesterol, low sugar and low carbohydrate diet as well as low-salt diet  4.  Further evaluation with a regadenoson MPI, will follow up on the results and will call with further recommendations  5.  I also recommend to repeat the sleep study and assess whether new settings are needed on the CPAP mask.  6.  Follow-up office visit in 6 months.             Current Medications (including today's revisions)  ??? acetaminophen (TYLENOL) 500 mg tablet Take 500 mg by mouth every 6 hours as needed for Pain. Max of 4,000 mg of acetaminophen in 24 hours.   ??? albuterol (PROAIR HFA, VENTOLIN HFA, OR PROVENTIL HFA) 90 mcg/actuation inhaler Inhale 2 puffs by mouth into the lungs every 6 hours as needed for Wheezing or Shortness of Breath. Shake well before use.   ??? amLODIPine (NORVASC) 10 mg tablet Take 10 mg by mouth daily.   ??? aspirin EC 81 mg tablet Take 1 tablet by mouth daily. Take with food.   ??? carvedilol (COREG) 12.5 mg tablet Take 1 tablet by mouth twice daily.

## 2019-02-02 ENCOUNTER — Encounter: Admit: 2019-02-02 | Discharge: 2019-02-03 | Payer: MEDICARE

## 2019-02-02 ENCOUNTER — Ambulatory Visit: Admit: 2019-02-02 | Discharge: 2019-02-03 | Payer: MEDICARE

## 2019-02-02 DIAGNOSIS — I10 Essential (primary) hypertension: Principal | ICD-10-CM

## 2019-02-03 ENCOUNTER — Encounter: Admit: 2019-02-03 | Discharge: 2019-02-03 | Payer: MEDICARE

## 2019-02-03 DIAGNOSIS — I10 Essential (primary) hypertension: Principal | ICD-10-CM

## 2019-02-07 ENCOUNTER — Encounter: Admit: 2019-02-07 | Discharge: 2019-02-07 | Payer: MEDICARE

## 2019-05-05 ENCOUNTER — Encounter: Admit: 2019-05-05 | Discharge: 2019-05-05

## 2019-05-05 MED ORDER — SIMVASTATIN 40 MG PO TAB
40 mg | ORAL_TABLET | Freq: Every evening | ORAL | 1 refills | Status: DC
Start: 2019-05-05 — End: 2019-11-09

## 2019-05-05 NOTE — Progress Notes
Dear Dr. Isidore Moos,    Dylan Cox (DOB [birth year]) is also followed by cardiologist, Dr. Samantha Crimes, MD.    Please send the following records for continuity of care:    Most recent lipid and liver profile, chemistry panel, and thyroid panel.    Please fax results to:     [facility] [facility]  Dionicia Abler / Kathe Mariner office fax: [phone]               Thank you,     Seth Bake, RN  Please call [phone] with any questions or concerns.

## 2019-07-07 ENCOUNTER — Encounter: Admit: 2019-07-07 | Discharge: 2019-07-07

## 2019-07-17 ENCOUNTER — Encounter: Admit: 2019-07-17 | Discharge: 2019-07-17

## 2019-08-17 ENCOUNTER — Encounter: Admit: 2019-08-17 | Discharge: 2019-08-17 | Payer: MEDICARE

## 2019-08-17 ENCOUNTER — Ambulatory Visit: Admit: 2019-08-17 | Discharge: 2019-08-17 | Payer: MEDICARE

## 2019-08-18 ENCOUNTER — Encounter: Admit: 2019-08-18 | Discharge: 2019-08-18 | Payer: MEDICARE

## 2019-08-18 ENCOUNTER — Ambulatory Visit: Admit: 2019-08-18 | Discharge: 2019-08-18 | Payer: MEDICARE

## 2019-08-21 NOTE — Telephone Encounter
Results and recommendations called to patient.

## 2019-10-03 ENCOUNTER — Encounter: Admit: 2019-10-03 | Discharge: 2019-10-03 | Payer: MEDICARE

## 2019-10-12 ENCOUNTER — Encounter: Admit: 2019-10-12 | Discharge: 2019-10-12 | Payer: MEDICARE

## 2019-10-12 DIAGNOSIS — R0602 Shortness of breath: Secondary | ICD-10-CM

## 2019-10-12 DIAGNOSIS — E6609 Other obesity due to excess calories: Secondary | ICD-10-CM

## 2019-10-12 DIAGNOSIS — I4892 Unspecified atrial flutter: Secondary | ICD-10-CM

## 2019-10-12 DIAGNOSIS — G4733 Obstructive sleep apnea (adult) (pediatric): Secondary | ICD-10-CM

## 2019-10-12 DIAGNOSIS — I25119 Atherosclerotic heart disease of native coronary artery with unspecified angina pectoris: Secondary | ICD-10-CM

## 2019-10-12 DIAGNOSIS — Z789 Other specified health status: Secondary | ICD-10-CM

## 2019-10-12 DIAGNOSIS — H3530 Unspecified macular degeneration: Secondary | ICD-10-CM

## 2019-10-12 DIAGNOSIS — R0600 Dyspnea, unspecified: Secondary | ICD-10-CM

## 2019-10-12 DIAGNOSIS — R9439 Abnormal result of other cardiovascular function study: Secondary | ICD-10-CM

## 2019-10-12 DIAGNOSIS — R079 Chest pain, unspecified: Secondary | ICD-10-CM

## 2019-10-12 DIAGNOSIS — I10 Essential (primary) hypertension: Secondary | ICD-10-CM

## 2019-10-12 NOTE — Progress Notes
Date of Service: 10/12/2019    Dylan Cox is a 68 y.o. male.       HPI     Dylan Cox is a 68 year old white male.  He does have a history of coronary artery disease.  Patient underwent PCI of the ramus in intermedius and PDA with drug-eluting stents in July 2018.    He has done well from a cardiac standpoint, has not had any significant episodes of chest pain.  Patient reports that in September prior to undergoing stress test evaluation, he did experience some chest discomfort due to some unpleasant circumstances, patient did take 1 sublingual nitroglycerin that led to pain relief.  He has not taken sublingual nitroglycerin on a regular basis.    He was evaluated with a perfusion imaging study and echocardiogram on August 18, 2019, the tomographic pattern did not demonstrate ischemia, the ejection fraction was normal at 57%.  On the echocardiogram the right ventricle was not optimally visualized, however it was some suggestion of right ventricular enlargement with preserved contractility, there were no valvular abnormalities and no wall motion abnormalities.    Patient is morbidly obese, his BMI is 40.82.    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    Patient does report rare episodes of sinking feeling, this could be secondary to episodic hypotension or may be a vasovagal response.         Vitals:    10/12/19 1429   BP: 128/74   BP Source: Arm, Left Upper;Arm, Right Upper   Pulse: 90   Temp: 36.6 ?C (97.8 ?F)   SpO2: 98%   Weight: (!) 136.5 kg (301 lb)   Height: 1.829 m (6')   PainSc: Zero     Body mass index is 40.82 kg/m?Marland Kitchen     Past Medical History  Patient Active Problem List    Diagnosis Date Noted   ? Weight gain 01/24/2019   ? Weight loss advised 01/24/2019   ? Preop cardiovascular exam 11/11/2017   ? On clopidogrel therapy 11/11/2017   ? Abnormal stress test 06/03/2017 ? Coronary artery disease involving native coronary artery of native heart with angina pectoris (HCC) 06/02/2017     06/02/2017 - mildly abnormal stress test. presented for cath. DES x1 to 80-90% Ramus and DES x 1 to 80-90% PDA      ? Abnormal thallium stress test 05/27/2017     Added automatically from request for surgery [phone]     ? Dyspnea 05/27/2017     Added automatically from request for surgery [id number]     ? Class 2 obesity due to excess calories without serious comorbidity with body mass index (BMI) of 38.0 to 38.9 in adult 05/27/2017   ? OSA (obstructive sleep apnea) 05/27/2017   ? Shortness of breath 05/17/2017   ? Hypertension 05/17/2017   ? Chest pain 05/17/2017     1. Holter - 05/05/17 at [facility] - 1. Normal 24 hour holter revealing NSR and no arrhythmias.       ? Atrial flutter (HCC) 05/17/2017         Review of Systems   Constitution: Negative.   HENT: Negative.    Eyes: Positive for vision loss in left eye and vision loss in right eye.   Cardiovascular: Negative.    Respiratory: Negative.    Endocrine: Negative.    Hematologic/Lymphatic: Negative.    Skin: Negative.    Musculoskeletal: Negative.    Gastrointestinal: Negative.  Genitourinary: Negative.    Neurological: Negative.    Psychiatric/Behavioral: Negative.    Allergic/Immunologic: Negative.        Physical Exam  General Appearance: obese  Skin: warm, moist, no ulcers or xanthomas  Eyes: conjunctivae and lids normal, pupils are equal and round  Lips & Oral Mucosa: no pallor or cyanosis  Neck Veins: neck veins are flat, neck veins are not distended  Chest Inspection: chest is normal in appearance  Respiratory Effort: breathing comfortably, no respiratory distress  Auscultation/Percussion: lungs clear to auscultation, no rales or rhonchi, no wheezing  Cardiac Rhythm: regular rhythm and normal rate  Cardiac Auscultation: S1, S2 normal, no rub, no gallop  Murmurs: no murmur Carotid Arteries: normal carotid upstroke bilaterally, no bruit  Abdominal Aorta: no abdominal aortic bruit  Lower Extremity Edema: no lower extremity edema  Abdominal Exam: soft, non-tender, no masses, bowel sounds normal  Liver & Spleen: no organomegaly  Gait & Station: normal balance and gait  Muscle Strength: normal strength and tone  Back: normal alignment and mobility, no deformity  Orientation: oriented to time, place and person  Affect & Mood: appropriate and sustained affect  Language and Memory: patient responsive and seems to comprehend information  Neurologic Exam: neurological assessment grossly intact        Cardiovascular Studies      Problems Addressed Today  Encounter Diagnoses   Name Primary?   ? Essential hypertension Yes   ? Coronary artery disease involving native coronary artery of native heart with angina pectoris (HCC)    ? Shortness of breath    ? Class 2 obesity due to excess calories without serious comorbidity with body mass index (BMI) of 38.0 to 38.9 in adult    ? OSA (obstructive sleep apnea)    ? Weight loss advised    ? Macular degeneration of left eye, unspecified type        Assessment and Plan     In summary: This is a 68 year old white male with stable coronary artery disease, s/p PCI of the ramus intermedius and PDA in July 2018, no significant recurrent episodes of chest pain, stress test evaluation in September 2020 did not reveal ischemia, left ventricular systolic function was normal, the echocardiogram revealed a perhaps mildly enlarged RV with preserved systolic function, this could be secondary to underlying morbid obesity and sleep apnea.    Plan:    1.  I did recommend weight reduction, I recommended Weight Watchers diet  2.  I asked the patient to discontinue furosemide  ? It is possible that he is on too much diuretic considering the fact that he also takes hydrochlorothiazide as part of the Zestoretic for blood pressure control  3.  I recommended a low-salt diet 4.  Follow-up office visit in approximately 85-month         Current Medications (including today's revisions)  ? acetaminophen (TYLENOL) 500 mg tablet Take 500 mg by mouth every 6 hours as needed for Pain. Max of 4,000 mg of acetaminophen in 24 hours.   ? albuterol (PROAIR HFA, VENTOLIN HFA, OR PROVENTIL HFA) 90 mcg/actuation inhaler Inhale 2 puffs by mouth into the lungs every 6 hours as needed for Wheezing or Shortness of Breath. Shake well before use.   ? amLODIPine (NORVASC) 10 mg tablet Take 10 mg by mouth daily.   ? aspirin EC 81 mg tablet Take 1 tablet by mouth daily. Take with food.   ? carvedilol (COREG) 12.5 mg tablet Take 1 tablet by  mouth twice daily.   ? diclofenac sodium 2 % sopk Apply  topically to affected area.   ? duloxetine DR (CYMBALTA) 60 mg capsule Take 60 mg by mouth daily.   ? gabapentin (NEURONTIN) 300 mg capsule Take 300 mg by mouth every 8 hours.   ? HYDROcodone/acetaminophen (NORCO) 5/325 mg tablet Take 1 tablet by mouth as Needed   ? lisinopriL-hydrochlorothiazide (ZESTORETIC) 20-12.5 mg tablet Take 2 tablets by mouth daily.   ? nitroglycerin (NITROSTAT) 0.4 mg tablet Take 1 tablet sublingually PRN for CP, take 3 thablets 5 min appart, if pain does not go away then call 911.   ? omeprazole DR(+) (PRILOSEC) 40 mg capsule Take 40 mg by mouth daily.   ? simvastatin (ZOCOR) 40 mg tablet Take one tablet by mouth at bedtime daily.   ? terbinafine (LAMISIL) 250 mg tablet Take 250 mg by mouth daily.

## 2019-11-09 ENCOUNTER — Encounter: Admit: 2019-11-09 | Discharge: 2019-11-09 | Payer: MEDICARE

## 2019-11-09 MED ORDER — SIMVASTATIN 40 MG PO TAB
ORAL_TABLET | Freq: Every evening | 3 refills | Status: AC
Start: 2019-11-09 — End: ?

## 2020-04-11 ENCOUNTER — Encounter: Admit: 2020-04-11 | Discharge: 2020-04-11 | Payer: MEDICARE

## 2020-04-11 DIAGNOSIS — I25119 Atherosclerotic heart disease of native coronary artery with unspecified angina pectoris: Secondary | ICD-10-CM

## 2020-04-11 DIAGNOSIS — R0600 Dyspnea, unspecified: Secondary | ICD-10-CM

## 2020-04-11 DIAGNOSIS — I10 Essential (primary) hypertension: Secondary | ICD-10-CM

## 2020-04-11 DIAGNOSIS — E6609 Other obesity due to excess calories: Secondary | ICD-10-CM

## 2020-04-11 DIAGNOSIS — R079 Chest pain, unspecified: Secondary | ICD-10-CM

## 2020-04-11 DIAGNOSIS — I4892 Unspecified atrial flutter: Secondary | ICD-10-CM

## 2020-04-11 DIAGNOSIS — R600 Localized edema: Secondary | ICD-10-CM

## 2020-04-11 DIAGNOSIS — R0602 Shortness of breath: Secondary | ICD-10-CM

## 2020-04-11 DIAGNOSIS — W19XXXA Unspecified fall, initial encounter: Secondary | ICD-10-CM

## 2020-04-11 DIAGNOSIS — Z7901 Long term (current) use of anticoagulants: Secondary | ICD-10-CM

## 2020-04-11 DIAGNOSIS — R9439 Abnormal result of other cardiovascular function study: Secondary | ICD-10-CM

## 2020-04-11 DIAGNOSIS — Z23 Encounter for immunization: Secondary | ICD-10-CM

## 2020-04-11 DIAGNOSIS — G4733 Obstructive sleep apnea (adult) (pediatric): Secondary | ICD-10-CM

## 2020-04-11 DIAGNOSIS — R635 Abnormal weight gain: Secondary | ICD-10-CM

## 2020-04-11 MED ORDER — CARVEDILOL 25 MG PO TAB
25 mg | ORAL_TABLET | Freq: Two times a day (BID) | ORAL | 3 refills | 90.00000 days | Status: AC
Start: 2020-04-11 — End: ?

## 2020-04-11 MED ORDER — CHLORTHALIDONE 25 MG PO TAB
25 mg | ORAL_TABLET | Freq: Every day | ORAL | 3 refills | Status: AC
Start: 2020-04-11 — End: ?

## 2020-04-11 MED ORDER — OLMESARTAN 20 MG PO TAB
20 mg | ORAL_TABLET | Freq: Two times a day (BID) | ORAL | 3 refills | 90.00000 days | Status: AC
Start: 2020-04-11 — End: ?

## 2020-04-11 NOTE — Progress Notes
Date of Service: 04/11/2020    Dylan Cox is a 69 y.o. male.       HPI     Dylan Cox is a 69 year old white male.  He does have a history of coronary artery disease, hypertension, hyperlipidemia and morbid obesity with a BMI of 43.56.  He is here today in our office and reports having episodes of shortness of breath, weight gain of approximately 20 pounds since 10/12/2019, bilateral lower extremity edema.  Patient has not been experiencing any chest pain.    He does have a history of CAD, he did undergo PCI of the ramus intermedius and PDA in July 2018.  A follow-up perfusion imaging study performed in September 2020 did not demonstrate ischemia, ejection fraction was normal.    Patient does report dietary indiscretion, noncompliance with a low-sodium diet and sedentary lifestyle.  As a matter of fact, since December 2018, he gained more than 30 pounds, at that time patient weighed 292 pounds.    The antihypertensive drug regimen that he is currently on does include amlodipine, carvedilol, and a combination of olmesartan/hydrochlorothiazide.  The lower extremity edema could also represent a side effect of the amlodipine.       Vitals:    04/11/20 1028 04/11/20 1037   BP: (!) 150/78 (!) 144/74   BP Source: Arm, Left Upper Arm, Right Upper   Patient Position: Sitting Sitting   Pulse: 88    SpO2: 97%    Weight: (!) 145.7 kg (321 lb 3.2 oz)    Height: 1.829 m (6')    PainSc: Seven      Body mass index is 43.56 kg/m?Marland Kitchen     Past Medical History  Patient Active Problem List    Diagnosis Date Noted   ? 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 Weight gain 01/24/2019   ? Weight loss advised 01/24/2019   ? Preop cardiovascular exam 11/11/2017   ? On clopidogrel therapy 11/11/2017   ? Abnormal stress test 06/03/2017   ? Coronary artery disease involving native coronary artery of native heart with angina pectoris (HCC) 06/02/2017     06/02/2017 - mildly abnormal stress test. presented for cath. DES x1 to 80-90% Ramus and DES x 1 to 80-90% PDA      ? Abnormal thallium stress test 05/27/2017     Added automatically from request for surgery [id number]     ? Dyspnea 05/27/2017     Added automatically from request for surgery [id number]     ? Class 2 obesity due to excess calories without serious comorbidity with body mass index (BMI) of 38.0 to 38.9 in adult 05/27/2017   ? OSA (obstructive sleep apnea) 05/27/2017   ? Shortness of breath 05/17/2017   ? Hypertension 05/17/2017   ? Chest pain 05/17/2017     1. Holter - 05/05/17 at [facility] - 1. Normal 24 hour holter revealing NSR and no arrhythmias.       ? Atrial flutter (HCC) 05/17/2017         Review of Systems   Constitution: Negative.   HENT: Positive for stridor and tinnitus.    Eyes: Positive for vision loss in right eye.   Cardiovascular: Positive for dyspnea on exertion and leg swelling.   Respiratory: Positive for shortness of breath and wheezing.    Endocrine: Negative.    Hematologic/Lymphatic: Negative.    Skin: Negative.    Musculoskeletal: Positive for back pain, falls, gout and neck pain.   Gastrointestinal:  Negative.    Genitourinary: Negative.    Neurological: Positive for numbness and paresthesias.   Psychiatric/Behavioral: The patient has insomnia.    Allergic/Immunologic: Negative.        Physical Exam  General Appearance: obese  Skin: warm, moist, no ulcers or xanthomas  Eyes: conjunctivae and lids normal, pupils are equal and round  Lips & Oral Mucosa: no pallor or cyanosis  Neck Veins: neck veins are flat, neck veins are not distended  Chest Inspection: chest is normal in appearance  Respiratory Effort: breathing comfortably, no respiratory distress  Auscultation/Percussion: lungs clear to auscultation, no rales or rhonchi, no wheezing  Cardiac Rhythm: regular rhythm and normal rate  Cardiac Auscultation: S1, S2 normal, no rub, no gallop  Murmurs: no murmur  Carotid Arteries: normal carotid upstroke bilaterally, no bruit  Abdominal aorta: could not be examined due to obese adomen  Lower Extremity Edema: 2+ bilateral pretibial lower extremity edema  Abdominal Exam: soft, non-tender, no masses, bowel sounds normal  Liver & Spleen: no organomegaly  Language and Memory: patient responsive and seems to comprehend information  Neurologic Exam: neurological assessment grossly intact    Cardiovascular Studies  Twelve-lead EKG demonstrates normal sinus rhythm, no ST segment T wave changes    Problems Addressed Today  Encounter Diagnoses   Name Primary?   ? Essential hypertension Yes   ? Atrial flutter, unspecified type (HCC)    ? Coronary artery disease involving native coronary artery of native heart with angina pectoris (HCC)    ? Shortness of breath    ? Chest pain, unspecified type    ? Abnormal thallium stress test    ? Class 2 obesity due to excess calories without serious comorbidity with body mass index (BMI) of 38.0 to 38.9 in adult    ? OSA (obstructive sleep apnea)    ? Abnormal stress test    ? On clopidogrel therapy    ? Fall, initial encounter    ? COVID-19 vaccine administered    ? Bilateral leg edema    ? Weight gain        Assessment and Plan     In summary: This is a 69 year old white male, he presents with the following cardiovascular issues:    Shortness of breath and weight gain of approximately 20 pounds since November 2020?this is perhaps due to dietary indiscretion, noncompliance with a low-sodium diet  Bilateral lower extremity edema?this could represent a side effect of the dihydropyridine CCB, it could also be reflective of diastolic dysfunction  Stable ischemic heart disease?patient did undergo PCI of the RI and PDA in July 2018, a follow-up perfusion imaging study performed in September 2020 did not demonstrate ischemia  Morbid obesity?patient's BMI is 43.56  Hyperlipidemia?patient is on statin therapy    Plan:    1.  Discontinue amlodipine, discontinue the combination of Benicar/hydrochlorothiazide  2.  Increase carvedilol 25 mg twice daily 3.  Change olmesartan to 20 mg p.o. twice daily  4.  Add chlorthalidone 25 mg p.o. every morning  5.  Chem-7 in 1 week  6.  Follow-up office visit in approximately 4 to 5 weeks.         Current Medications (including today's revisions)  ? acetaminophen (TYLENOL) 500 mg tablet Take 500 mg by mouth every 6 hours as needed for Pain. Max of 4,000 mg of acetaminophen in 24 hours.   ? albuterol (PROAIR HFA, VENTOLIN HFA, OR PROVENTIL HFA) 90 mcg/actuation inhaler Inhale 2 puffs by mouth into the lungs  every 6 hours as needed for Wheezing or Shortness of Breath. Shake well before use.   ? allopurinoL (ZYLOPRIM) 300 mg tablet Take 1 tablet by mouth daily.   ? amLODIPine (NORVASC) 10 mg tablet Take 10 mg by mouth daily.   ? aspirin EC 81 mg tablet Take 1 tablet by mouth daily. Take with food.   ? carvedilol (COREG) 12.5 mg tablet Take 1 tablet by mouth twice daily.   ? colchicine 0.6 mg tablet Take 0.6 mg by mouth daily.   ? diclofenac sodium 2 % sopk Apply  topically to affected area.   ? duloxetine DR (CYMBALTA) 60 mg capsule Take 60 mg by mouth daily.   ? gabapentin (NEURONTIN) 300 mg capsule Take 300 mg by mouth every 8 hours.   ? HYDROcodone/acetaminophen (NORCO) 5/325 mg tablet Take 1 tablet by mouth as Needed   ? INCRUSE ELLIPTA 62.5 mcg/actuation inhalation disk Inhale 1 puff by mouth into the lungs daily.   ? nitroglycerin (NITROSTAT) 0.4 mg tablet Take 1 tablet sublingually PRN for CP, take 3 thablets 5 min appart, if pain does not go away then call 911.   ? olmesartan-hydrochlorothiazide (BENICAR HCT) 40-25 mg tablet Take 1 tablet by mouth daily.   ? omeprazole DR(+) (PRILOSEC) 40 mg capsule Take 40 mg by mouth daily.   ? simvastatin (ZOCOR) 40 mg tablet TAKE 1 TABLET BY MOUTH AT BEDTIME   ? terbinafine (LAMISIL) 250 mg tablet Take 250 mg by mouth daily.

## 2020-04-24 ENCOUNTER — Encounter: Admit: 2020-04-24 | Discharge: 2020-04-24 | Payer: MEDICARE

## 2020-04-24 DIAGNOSIS — R600 Localized edema: Secondary | ICD-10-CM

## 2020-04-24 DIAGNOSIS — I25119 Atherosclerotic heart disease of native coronary artery with unspecified angina pectoris: Secondary | ICD-10-CM

## 2020-04-24 DIAGNOSIS — I10 Essential (primary) hypertension: Secondary | ICD-10-CM

## 2020-04-24 LAB — COMPREHENSIVE METABOLIC PANEL
Lab: 0.5
Lab: 1
Lab: 104
Lab: 112 — ABNORMAL HIGH (ref 70–105)
Lab: 139
Lab: 15 — ABNORMAL HIGH (ref 0–14)
Lab: 24
Lab: 24
Lab: 28
Lab: 29
Lab: 4
Lab: 4.1
Lab: 56
Lab: 7.5
Lab: 9.4

## 2020-05-21 ENCOUNTER — Encounter: Admit: 2020-05-21 | Discharge: 2020-05-21 | Payer: MEDICARE

## 2020-05-21 DIAGNOSIS — I25119 Atherosclerotic heart disease of native coronary artery with unspecified angina pectoris: Secondary | ICD-10-CM

## 2020-05-21 DIAGNOSIS — R0602 Shortness of breath: Secondary | ICD-10-CM

## 2020-05-21 DIAGNOSIS — G4733 Obstructive sleep apnea (adult) (pediatric): Secondary | ICD-10-CM

## 2020-05-21 DIAGNOSIS — E6609 Other obesity due to excess calories: Secondary | ICD-10-CM

## 2020-05-21 DIAGNOSIS — I10 Essential (primary) hypertension: Secondary | ICD-10-CM

## 2020-05-21 DIAGNOSIS — R600 Localized edema: Secondary | ICD-10-CM

## 2020-05-21 DIAGNOSIS — R079 Chest pain, unspecified: Secondary | ICD-10-CM

## 2020-05-21 DIAGNOSIS — R9439 Abnormal result of other cardiovascular function study: Secondary | ICD-10-CM

## 2020-05-21 DIAGNOSIS — I4892 Unspecified atrial flutter: Secondary | ICD-10-CM

## 2020-05-21 DIAGNOSIS — Z23 Encounter for immunization: Secondary | ICD-10-CM

## 2020-05-21 DIAGNOSIS — R0600 Dyspnea, unspecified: Secondary | ICD-10-CM

## 2020-05-21 NOTE — Progress Notes
Date of Service: 05/21/2020    Dylan Cox is a 69 y.o. male.       HPI     In the last office visit on 04/11/2020, Mr. Dylan Cox presented with symptoms of increased shortness of breath, weight gain compared to November 2020, bilateral lower extremity edema.    The antihypertensive drug regimen was adjusted, he was taken off amlodipine and also the combination of Benicar and hydrochlorothiazide, I did increase the carvedilol, I asked the patient to divide olmesartan in 20 mg p.o. twice daily and I added chlorthalidone.    Is here today for a follow-up office visit.  Patient is doing well from a cardiac standpoint.  His blood pressure is under good control.  He has made dietary changes and tries to follow a low-sodium diet.  According to my chart he also lost approximately 9 pounds since 04/11/2020.    He has not experienced chest pain, no heart palpitations, no presyncope or syncope.       Vitals:    05/21/20 1312 05/21/20 1313   BP: 136/76 132/78   BP Source: Arm, Left Upper Arm, Right Upper   Patient Position: Sitting Sitting   Pulse: 63    SpO2: 96%    Weight: (!) 141.5 kg (312 lb)    Height: 1.829 m (6')    PainSc: Six      Body mass index is 42.31 kg/m?Marland Kitchen     Past Medical History  Patient Active Problem List    Diagnosis Date Noted   ? Fall 04/11/2020   ? COVID-19 vaccine administered 04/11/2020   ? Bilateral leg edema 04/11/2020   ? Weight gain 04/11/2020   ? 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 Weight gain 01/24/2019   ? Weight loss advised 01/24/2019   ? Preop cardiovascular exam 11/11/2017   ? On clopidogrel therapy 11/11/2017   ? Abnormal stress test 06/03/2017   ? Coronary artery disease involving native coronary artery of native heart with angina pectoris (HCC) 06/02/2017     06/02/2017 - mildly abnormal stress test. presented for cath. DES x1 to 80-90% Ramus and DES x 1 to 80-90% PDA      ? Abnormal thallium stress test 05/27/2017     Added automatically from request for surgery [id number] ? Dyspnea 05/27/2017     Added automatically from request for surgery [phone]     ? Class 2 obesity due to excess calories without serious comorbidity with body mass index (BMI) of 38.0 to 38.9 in adult 05/27/2017   ? OSA (obstructive sleep apnea) 05/27/2017   ? Shortness of breath 05/17/2017   ? Hypertension 05/17/2017   ? Chest pain 05/17/2017     1. Holter - 05/05/17 at [facility] - 1. Normal 24 hour holter revealing NSR and no arrhythmias.       ? Atrial flutter (HCC) 05/17/2017         Review of Systems   Constitution: Negative.   HENT: Negative.    Eyes: Negative.    Cardiovascular: Negative.    Respiratory: Negative.    Endocrine: Negative.    Hematologic/Lymphatic: Negative.    Skin: Negative.    Musculoskeletal: Negative.    Gastrointestinal: Negative.    Genitourinary: Negative.    Neurological: Negative.    Psychiatric/Behavioral: Negative.    Allergic/Immunologic: Negative.        Physical Exam  General Appearance: obese  Skin: warm, moist, no ulcers or xanthomas  Eyes: conjunctivae and lids normal,  pupils are equal and round  Lips & Oral Mucosa: no pallor or cyanosis  Neck Veins: neck veins are flat, neck veins are not distended  Chest Inspection: chest is normal in appearance  Respiratory Effort: breathing comfortably, no respiratory distress  Auscultation/Percussion: lungs clear to auscultation, no rales or rhonchi, no wheezing  Cardiac Rhythm: regular rhythm and normal rate  Cardiac Auscultation: S1, S2 normal, no rub, no gallop  Murmurs: no murmur  Carotid Arteries: normal carotid upstroke bilaterally, no bruit  Abdominal aorta: could not be examined due to obese adomen  Lower Extremity Edema: no lower extremity edema  Abdominal Exam: soft, non-tender, no masses, bowel sounds normal  Liver & Spleen: no organomegaly  Language and Memory: patient responsive and seems to comprehend information  Neurologic Exam: neurological assessment grossly intact    Cardiovascular Studies      Problems Addressed Today  Encounter Diagnoses   Name Primary?   ? Essential hypertension Yes   ? Atrial flutter, unspecified type (HCC)    ? Coronary artery disease involving native coronary artery of native heart with angina pectoris (HCC)    ? Shortness of breath    ? Chest pain, unspecified type    ? Abnormal thallium stress test    ? Class 2 obesity due to excess calories without serious comorbidity with body mass index (BMI) of 38.0 to 38.9 in adult    ? OSA (obstructive sleep apnea)    ? Abnormal stress test    ? COVID-19 vaccine administered    ? Bilateral leg edema        Assessment and Plan     In summary: This is a 69 year old white male that presents with the following cardiovascular issues:    1.  Symptoms of shortness of breath, weight gain of approximately 20 pounds since November 2020  ? The symptoms are probably secondary to dietary indiscretion, weight gain, suboptimally controlled hypertension and diastolic dysfunction    2.  History of bilateral lower extremity edema  ? This probably represented an effect of the calcium channel blocker therapy as well as diastolic dysfunction  ? The bilateral lower extremity edema resolved with discontinuing amlodipine and changing the diuretic    3.  Stable ischemic heart disease  ? Patient did undergo PCI of the RI and PDA in July 2018, a follow-up perfusion imaging study performed in September 2020 did not demonstrate ischemia    4.  Morbid obesity  ? Patient's BMI is 42.31  ? Since the last office visit patient did lose approximately 9 pounds, I think this is a combination of diuretic therapy as well as a side effect of the calcium channel blocker which was causing lower extremity edema  ? Patient's BMI decreased    5.  Hyperlipidemia?patient is on statin therapy.    Plan:    1.  I asked the patient to continue all current antihypertensive drug regimen  2.  I continue to counsel him on the importance of risk factors modification, low-fat, low-cholesterol, low sugar, low carbohydrate and low-salt diet  3.  Follow-up office visit in approximately 6 months             Current Medications (including today's revisions)  ? acetaminophen (TYLENOL) 500 mg tablet Take 500 mg by mouth every 6 hours as needed for Pain. Max of 4,000 mg of acetaminophen in 24 hours.   ? albuterol (PROAIR HFA, VENTOLIN HFA, OR PROVENTIL HFA) 90 mcg/actuation inhaler Inhale 2 puffs by mouth into the  lungs every 6 hours as needed for Wheezing or Shortness of Breath. Shake well before use.   ? allopurinoL (ZYLOPRIM) 300 mg tablet Take 1 tablet by mouth daily.   ? aspirin EC 81 mg tablet Take 1 tablet by mouth daily. Take with food.   ? carvediloL (COREG) 25 mg tablet Take one tablet by mouth twice daily.   ? chlorthalidone (HYGROTON) 25 mg tablet Take one tablet by mouth daily.   ? colchicine 0.6 mg tablet Take 0.6 mg by mouth daily.   ? diclofenac sodium 2 % sopk Apply  topically to affected area.   ? duloxetine DR (CYMBALTA) 60 mg capsule Take 60 mg by mouth daily.   ? gabapentin (NEURONTIN) 300 mg capsule Take 300 mg by mouth every 8 hours.   ? HYDROcodone/acetaminophen (NORCO) 5/325 mg tablet Take 1 tablet by mouth as Needed   ? INCRUSE ELLIPTA 62.5 mcg/actuation inhalation disk Inhale 1 puff by mouth into the lungs daily.   ? nitroglycerin (NITROSTAT) 0.4 mg tablet Take 1 tablet sublingually PRN for CP, take 3 thablets 5 min appart, if pain does not go away then call 911.   ? olmesartan (BENICAR) 20 mg tablet Take one tablet by mouth twice daily.   ? omeprazole DR(+) (PRILOSEC) 40 mg capsule Take 40 mg by mouth daily.   ? simvastatin (ZOCOR) 40 mg tablet TAKE 1 TABLET BY MOUTH AT BEDTIME   ? terbinafine (LAMISIL) 250 mg tablet Take 250 mg by mouth daily.

## 2020-09-28 ENCOUNTER — Encounter: Admit: 2020-09-28 | Discharge: 2020-09-28 | Payer: MEDICARE

## 2020-09-28 MED ORDER — OLMESARTAN 20 MG PO TAB
ORAL_TABLET | Freq: Two times a day (BID) | 3 refills
Start: 2020-09-28 — End: ?

## 2020-10-23 ENCOUNTER — Encounter: Admit: 2020-10-23 | Discharge: 2020-10-23 | Payer: MEDICARE

## 2020-10-23 MED ORDER — SIMVASTATIN 40 MG PO TAB
ORAL_TABLET | Freq: Every evening | 3 refills | Status: AC
Start: 2020-10-23 — End: ?

## 2020-11-07 ENCOUNTER — Encounter: Admit: 2020-11-07 | Discharge: 2020-11-07 | Payer: MEDICARE

## 2020-11-07 DIAGNOSIS — R635 Abnormal weight gain: Secondary | ICD-10-CM

## 2020-11-07 DIAGNOSIS — E6609 Other obesity due to excess calories: Secondary | ICD-10-CM

## 2020-11-07 DIAGNOSIS — I10 Essential (primary) hypertension: Secondary | ICD-10-CM

## 2020-11-07 DIAGNOSIS — I4892 Unspecified atrial flutter: Secondary | ICD-10-CM

## 2020-11-07 DIAGNOSIS — J438 Other emphysema: Secondary | ICD-10-CM

## 2020-11-07 DIAGNOSIS — R079 Chest pain, unspecified: Secondary | ICD-10-CM

## 2020-11-07 DIAGNOSIS — Z87891 Personal history of nicotine dependence: Secondary | ICD-10-CM

## 2020-11-07 DIAGNOSIS — I25119 Atherosclerotic heart disease of native coronary artery with unspecified angina pectoris: Secondary | ICD-10-CM

## 2020-11-07 DIAGNOSIS — R9439 Abnormal result of other cardiovascular function study: Secondary | ICD-10-CM

## 2020-11-07 DIAGNOSIS — G4733 Obstructive sleep apnea (adult) (pediatric): Secondary | ICD-10-CM

## 2020-11-07 DIAGNOSIS — Z23 Encounter for immunization: Secondary | ICD-10-CM

## 2020-11-07 DIAGNOSIS — R0600 Dyspnea, unspecified: Secondary | ICD-10-CM

## 2020-11-07 NOTE — Progress Notes
Date of Service: 11/07/2020    Dylan Cox is a 69 y.o. male.       HPI     Mr. Dylan Cox is here today for a follow-up office visit.    This is a 69 year old white male with known history of coronary artery disease.  Patient was evaluated with a perfusion imaging study in June 2018, it was abnormal, subsequently he did undergo a left heart catheterization on 05/23/2017.    The study did result in PCI of the intermediate branch and mid posterior PDA with drug-eluting stents.    The most recent perfusion imaging study performed in September 2020 did not demonstrate ischemia, the ejection fraction was normal at 57%.    Patient does not report any symptoms of chest pain, no heart palpitations, he has not had angina has not taken sublingual nitroglycerin.    In the past he did have lower extremity edema, probably there was secondary to diastolic dysfunction and also a side effect of the calcium channel blocker therapy.  The antihypertensive drug therapy has been changed and she is currently doing very well.    Unfortunately due to dietary indiscretion patient gained weight, in March 2020 he weighed 294 pounds, today he weighed 318 pounds.    He also reports a recent diagnosis of COPD, patient did smoke in the past, approximately 2-1/2 packs of cigarettes a day for approximately 20 years.          Vitals:    11/07/20 1100 11/07/20 1114   BP: 130/78 124/68   BP Source: Arm, Left Upper Arm, Right Upper   Patient Position: Sitting Sitting   Pulse: 70    SpO2: 97%    Weight: (!) 144.3 kg (318 lb 3.2 oz)    Height: 1.829 m (6')    PainSc: Five      Body mass index is 43.16 kg/m?Dylan Cox     Past Medical History  Patient Active Problem List    Diagnosis Date Noted   ? Fall 04/11/2020   ? COVID-19 vaccine administered 04/11/2020   ? Bilateral leg edema 04/11/2020   ? Weight gain 04/11/2020   ? 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 Weight gain 01/24/2019   ? Weight loss advised 01/24/2019   ? Preop cardiovascular exam 11/11/2017   ? On clopidogrel therapy 11/11/2017   ? Abnormal stress test 06/03/2017   ? Coronary artery disease involving native coronary artery of native heart with angina pectoris (HCC) 06/02/2017     06/02/2017 - mildly abnormal stress test. presented for cath. DES x1 to 80-90% Ramus and DES x 1 to 80-90% PDA      ? Abnormal thallium stress test 05/27/2017     Added automatically from request for surgery [phone]     ? Dyspnea 05/27/2017     Added automatically from request for surgery [phone]     ? Class 2 obesity due to excess calories without serious comorbidity with body mass index (BMI) of 38.0 to 38.9 in adult 05/27/2017   ? OSA (obstructive sleep apnea) 05/27/2017   ? Shortness of breath 05/17/2017   ? Hypertension 05/17/2017   ? Chest pain 05/17/2017     1. Holter - 05/05/17 at [facility] - 1. Normal 24 hour holter revealing NSR and no arrhythmias.       ? Atrial flutter (HCC) 05/17/2017         Review of Systems   Constitutional: Positive for weight gain.   HENT: Positive  for stridor and tinnitus.    Eyes: Positive for blurred vision and vision loss in right eye.   Cardiovascular: Positive for dyspnea on exertion and leg swelling.   Respiratory: Positive for shortness of breath.    Endocrine: Negative.    Hematologic/Lymphatic: Negative.    Skin: Negative.    Musculoskeletal: Positive for back pain, joint pain, muscle cramps and myalgias.   Gastrointestinal: Negative.    Genitourinary: Negative.    Neurological: Positive for numbness and paresthesias.   Psychiatric/Behavioral: Negative.    Allergic/Immunologic: Negative.        Physical Exam  General Appearance: obese  Skin: warm, moist, no ulcers or xanthomas  Eyes: conjunctivae and lids normal, pupils are equal and round  Lips & Oral Mucosa: no pallor or cyanosis  Neck Veins: neck veins are flat, neck veins are not distended  Chest Inspection: chest is normal in appearance  Respiratory Effort: breathing comfortably, no respiratory distress  Auscultation/Percussion: lungs clear to auscultation, no rales or rhonchi, no wheezing  Cardiac Rhythm: regular rhythm and normal rate  Cardiac Auscultation: S1, S2 normal, no rub, no gallop  Murmurs: no murmur  Carotid Arteries: normal carotid upstroke bilaterally, no bruit  Abdominal aorta: could not be examined due to obese adomen  Lower Extremity Edema: no lower extremity edema  Abdominal Exam: soft, non-tender, no masses, bowel sounds normal  Liver & Spleen: no organomegaly  Language and Memory: patient responsive and seems to comprehend information  Neurologic Exam: neurological assessment grossly intact        Cardiovascular Studies      Cardiovascular Health Factors  Vitals BP Readings from Last 3 Encounters:   11/07/20 124/68   05/21/20 132/78   04/11/20 (!) 144/74     Wt Readings from Last 3 Encounters:   11/07/20 (!) 144.3 kg (318 lb 3.2 oz)   05/21/20 (!) 141.5 kg (312 lb)   04/11/20 (!) 145.7 kg (321 lb 3.2 oz)     BMI Readings from Last 3 Encounters:   11/07/20 43.16 kg/m?   05/21/20 42.31 kg/m?   04/11/20 43.56 kg/m?      Smoking Social History     Tobacco Use   Smoking Status Former Smoker   ? Quit date: 05/27/2002   ? Years since quitting: 18.4   Smokeless Tobacco Never Used      Lipid Profile Cholesterol   Date Value Ref Range Status   06/07/2018 122 (L) 150 - 200 Final     HDL   Date Value Ref Range Status   06/07/2018 42  Final     LDL   Date Value Ref Range Status   06/07/2018 63  Final     Triglycerides   Date Value Ref Range Status   06/07/2018 105  Final      Blood Sugar Hemoglobin A1C   Date Value Ref Range Status   04/22/2017 5.8  Final     Glucose   Date Value Ref Range Status   04/24/2020 112 (H) 70 - 105 Final   01/16/2019 81  Final   06/03/2017 102 (H) 70 - 100 MG/DL Final          Problems Addressed Today  Encounter Diagnoses   Name Primary?   ? Atrial flutter, unspecified type (HCC) Yes   ? Coronary artery disease involving native coronary artery of native heart with angina pectoris (HCC)    ? Primary hypertension    ? Abnormal thallium stress test    ? Class 2  obesity due to excess calories without serious comorbidity with body mass index (BMI) of 38.0 to 38.9 in adult    ? COVID-19 vaccine administered    ? OSA (obstructive sleep apnea)    ? Weight gain        Assessment and Plan     In summary: This is a 69 year old white male that presents with the following cardiovascular issues:    1.  Stable ischemic heart disease  ? Patient did undergo PCI of the RIN PDA in July 2018  ? The perfusion imaging study performed in September 2020 did not demonstrate ischemia    2.  History of bilateral lower extremity edema  ? This probably represented an effect of the calcium channel blocker therapy as well as diastolic dysfunction  ? The bilateral lower extremity edema resolved with discontinuing amlodipine and changing the diuretic    3.  Morbid obesity  ? Patient's BMI today is 43.16  ? Patient does admit to dietary indiscretion, he gained 24 pounds since March 2020    4.  Other cardiac comorbidities include: Hypertension and hyperlipidemia.    Plan:    1.  Continue all current cardiac medications  2.  I did advise on weight reduction, low-fat, low-cholesterol, low sugar, low carbohydrate, low sodium diet  3.  Follow-up office visit with me in July?August 2022             Current Medications (including today's revisions)  ? acetaminophen (TYLENOL) 500 mg tablet Take 500 mg by mouth every 6 hours as needed for Pain. Max of 4,000 mg of acetaminophen in 24 hours.   ? albuterol (PROAIR HFA, VENTOLIN HFA, OR PROVENTIL HFA) 90 mcg/actuation inhaler Inhale 2 puffs by mouth into the lungs every 6 hours as needed for Wheezing or Shortness of Breath. Shake well before use.   ? allopurinoL (ZYLOPRIM) 300 mg tablet Take 1 tablet by mouth daily.   ? aspirin EC 81 mg tablet Take 1 tablet by mouth daily. Take with food.   ? carvediloL (COREG) 25 mg tablet Take one tablet by mouth twice daily.   ? chlorthalidone (HYGROTON) 25 mg tablet Take one tablet by mouth daily.   ? colchicine 0.6 mg tablet Take 0.6 mg by mouth daily.   ? diclofenac sodium 2 % sopk Apply  topically to affected area.   ? duloxetine DR (CYMBALTA) 60 mg capsule Take 60 mg by mouth daily.   ? gabapentin (NEURONTIN) 300 mg capsule Take 300 mg by mouth every 8 hours.   ? HYDROcodone/acetaminophen (NORCO) 5/325 mg tablet Take 1 tablet by mouth as Needed   ? INCRUSE ELLIPTA 62.5 mcg/actuation inhalation disk Inhale 1 puff by mouth into the lungs daily.   ? nitroglycerin (NITROSTAT) 0.4 mg tablet Take 1 tablet sublingually PRN for CP, take 3 thablets 5 min appart, if pain does not go away then call 911.   ? olmesartan (BENICAR) 20 mg tablet TAKE 1 TABLET BY MOUTH TWICE A DAY   ? omeprazole DR(+) (PRILOSEC) 40 mg capsule Take 40 mg by mouth daily.   ? simvastatin (ZOCOR) 40 mg tablet TAKE 1 TABLET BY MOUTH AT BEDTIME   ? tamsulosin (FLOMAX) 0.4 mg capsule Take 1 capsule by mouth daily.   ? terbinafine (LAMISIL) 250 mg tablet Take 250 mg by mouth daily.

## 2021-04-03 ENCOUNTER — Encounter: Admit: 2021-04-03 | Discharge: 2021-04-03 | Payer: MEDICARE

## 2021-04-03 MED ORDER — CHLORTHALIDONE 25 MG PO TAB
ORAL_TABLET | Freq: Every day | 3 refills | Status: AC
Start: 2021-04-03 — End: ?

## 2021-04-11 ENCOUNTER — Encounter: Admit: 2021-04-11 | Discharge: 2021-04-11 | Payer: MEDICARE

## 2021-04-11 MED ORDER — CARVEDILOL 25 MG PO TAB
ORAL_TABLET | Freq: Two times a day (BID) | ORAL | 3 refills | 90.00000 days | Status: AC
Start: 2021-04-11 — End: ?

## 2021-04-11 NOTE — Telephone Encounter
Received a request via computer from the patients pharmacy requesting a refill.  Script e-scribed as requested.

## 2021-06-05 ENCOUNTER — Encounter: Admit: 2021-06-05 | Discharge: 2021-06-05 | Payer: MEDICARE

## 2021-06-05 DIAGNOSIS — I10 Essential (primary) hypertension: Secondary | ICD-10-CM

## 2021-06-05 DIAGNOSIS — E6609 Other obesity due to excess calories: Secondary | ICD-10-CM

## 2021-06-05 DIAGNOSIS — I4892 Unspecified atrial flutter: Secondary | ICD-10-CM

## 2021-06-05 DIAGNOSIS — R9439 Abnormal result of other cardiovascular function study: Secondary | ICD-10-CM

## 2021-06-05 DIAGNOSIS — G4733 Obstructive sleep apnea (adult) (pediatric): Secondary | ICD-10-CM

## 2021-06-05 DIAGNOSIS — J438 Other emphysema: Secondary | ICD-10-CM

## 2021-06-05 DIAGNOSIS — I25119 Atherosclerotic heart disease of native coronary artery with unspecified angina pectoris: Secondary | ICD-10-CM

## 2021-06-05 DIAGNOSIS — R0600 Dyspnea, unspecified: Secondary | ICD-10-CM

## 2021-06-05 DIAGNOSIS — U071 Upper respiratory tract infection due to COVID-19 virus: Secondary | ICD-10-CM

## 2021-06-05 DIAGNOSIS — R079 Chest pain, unspecified: Secondary | ICD-10-CM

## 2021-06-05 NOTE — Progress Notes
Date of Service: 06/05/2021    Dylan Cox is a 70 y.o. male.       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    Patient has been experiencing atypical chest pain, these are left-sided located, they are nonradiating, not associated with physical activity, he points to the left sided chest area with his index finger.  Patient did not take any sublingual nitroglycerin.    He underwent evaluation with a perfusion imaging study in September 2020, the tomographic pattern was unremarkable, normal LVEF = 57%.     Vitals:    06/05/21 1354 06/05/21 1410   BP: 118/64 124/72   BP Source: Arm, Left Upper Arm, Right Upper   Pulse: 88    SpO2: 94%    O2 Percent: 94 %    O2 Device: None (Room air)    PainSc: Zero    Weight: (!) 143.2 kg (315 lb 9.6 oz)    Height: 182.9 cm (6')      Body mass index is 42.8 kg/m?Marland Kitchen     Past Medical History  Patient Active Problem List    Diagnosis Date Noted   ? Upper respiratory tract infection due to COVID-19 virus 06/05/2021   ? History of tobacco use 11/07/2020   ? Other emphysema (HCC) 11/07/2020   ? Fall 04/11/2020   ? COVID-19 vaccine administered 04/11/2020   ? Bilateral leg edema 04/11/2020   ? Weight gain 04/11/2020   ? 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 Weight gain 01/24/2019   ? Weight loss advised 01/24/2019   ? Preop cardiovascular exam 11/11/2017   ? On clopidogrel therapy 11/11/2017   ? Abnormal stress test 06/03/2017   ? Coronary artery disease involving native coronary artery of native heart with angina pectoris (HCC) 06/02/2017     06/02/2017 - mildly abnormal stress test. presented for cath. DES x1 to 80-90% Ramus and DES x 1 to 80-90% PDA      ? Abnormal thallium stress test 05/27/2017     Added automatically from request for surgery [phone]     ? Dyspnea 05/27/2017     Added automatically from request for surgery [phone]     ? Class 2 obesity due to excess calories without serious comorbidity with body mass index (BMI) of 38.0 to 38.9 in adult 05/27/2017   ? OSA (obstructive sleep apnea) 05/27/2017   ? Shortness of breath 05/17/2017   ? Hypertension 05/17/2017   ? Chest pain 05/17/2017     1. Holter - 05/05/17 at [facility] - 1. Normal 24 hour holter revealing NSR and no arrhythmias.       ? Atrial flutter (HCC) 05/17/2017         Review of Systems   Constitutional: Positive for malaise/fatigue.   HENT: Positive for stridor and tinnitus.    Eyes: Positive for blurred vision and vision loss in right eye.        Macular Degeneration   Cardiovascular: Positive for dyspnea on exertion and leg swelling.  Chest discomfort   Respiratory: Positive for cough, shortness of breath, sputum production and wheezing.    Endocrine: Negative.    Hematologic/Lymphatic: Negative.    Skin: Negative.    Musculoskeletal: Positive for back pain, muscle cramps and muscle weakness.   Gastrointestinal: Negative.    Genitourinary: Negative.    Neurological: Positive for numbness, paresthesias and weakness.   Psychiatric/Behavioral: Negative.    Allergic/Immunologic: Negative.        Physical Exam    General Appearance: obese  Skin: warm, moist, no ulcers or xanthomas  Eyes: conjunctivae and lids normal, pupils are equal and round  Lips & Oral Mucosa: no pallor or cyanosis  Neck Veins: neck veins are flat, neck veins are not distended  Chest Inspection: chest is normal in appearance  Respiratory Effort: breathing comfortably, no respiratory distress  Auscultation/Percussion: lungs clear to auscultation, no rales or rhonchi, no wheezing  Cardiac Rhythm: regular rhythm and normal rate  Cardiac Auscultation: S1, S2 normal, no rub, no gallop  Murmurs: no murmur  Carotid Arteries: normal carotid upstroke bilaterally, no bruit  Abdominal aorta: could not be examined due to obese adomen  Lower Extremity Edema: no lower extremity edema  Abdominal Exam: soft, non-tender, no masses, bowel sounds normal  Liver & Spleen: no organomegaly  Language and Memory: patient responsive and seems to comprehend information  Neurologic Exam: neurological assessment grossly intact      Cardiovascular Studies  Twelve-lead EKG demonstrates normal sinus rhythm, no ST segment T wave changes    Cardiovascular Health Factors  Vitals BP Readings from Last 3 Encounters:   06/05/21 124/72   11/07/20 124/68   05/21/20 132/78     Wt Readings from Last 3 Encounters:   06/05/21 (!) 143.2 kg (315 lb 9.6 oz)   11/07/20 (!) 144.3 kg (318 lb 3.2 oz)   05/21/20 (!) 141.5 kg (312 lb)     BMI Readings from Last 3 Encounters:   06/05/21 42.80 kg/m?   11/07/20 43.16 kg/m?   05/21/20 42.31 kg/m?      Smoking Social History     Tobacco Use   Smoking Status Former Smoker   ? Quit date: 05/27/2002   ? Years since quitting: 19.0   Smokeless Tobacco Never Used      Lipid Profile Cholesterol   Date Value Ref Range Status   06/07/2018 122 (L) 150 - 200 Final     HDL   Date Value Ref Range Status   06/07/2018 42  Final     LDL   Date Value Ref Range Status   06/07/2018 63  Final     Triglycerides   Date Value Ref Range Status   06/07/2018 105  Final      Blood Sugar Hemoglobin A1C   Date Value Ref Range Status   04/22/2017 5.8  Final     Glucose   Date Value Ref Range Status   04/24/2020 112 (H) 70 - 105 Final   01/16/2019 81  Final   06/03/2017 102 (H) 70 - 100 MG/DL Final          Problems Addressed Today  Encounter Diagnoses   Name Primary?   ? Atrial flutter, unspecified type (HCC) Yes   ? Coronary artery disease involving native coronary artery of native heart with angina pectoris (HCC)    ? Primary hypertension    ? Abnormal stress test    ? Class 2 obesity due to excess calories without serious comorbidity with body mass index (BMI)  of 38.0 to 38.9 in adult    ? OSA (obstructive sleep apnea)    ? Other emphysema (HCC)    ? Upper respiratory tract infection due to COVID-19 virus        Assessment and Plan     Assessment:    1.  Atypical chest pain-patient's symptoms are not compatible with angina and he did not require sublingual nitroglycerin  2.  Coronary artery disease-patient did undergo PCI of the ramus intermedius and PDA in July 2018.  The most recent perfusion imaging study performed in September 2020 did not demonstrate ischemia, normal LVEF  3.  Morbid obesity-patient's BMI is 42.80 kg/m?  4.  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  Primary hypertension-under good control  6.  Hyperlipidemia-patient is on statin therapy  7.  OSA-patient does use a CPAP machine  8.  COPD-patient is on inhalers, he does not use home oxygen therapy    Plan:    1.  Continue all current medications  2.  I suggest to initiate physical therapy for overall strengthening exercises  3.  We will proceed with a perfusion imaging study to follow-up on this patient's coronary artery disease, the study will perhaps be performed in October -- November 2022, we will follow-up on the results and will call with further recommendations  4.  Follow-up office visit in 10 to 12 months.    Total Time Today was 30 minutes in the following activities: Preparing to see the patient, Obtaining and/or reviewing separately obtained history, Performing a medically appropriate examination and/or evaluation, Counseling and educating the patient/family/caregiver, Ordering medications, tests, or procedures, Referring and communication with other health care professionals (when not separately reported), Documenting clinical information in the electronic or other health record, Independently interpreting results (not separately reported) and communicating results to the patient/family/caregiver and Care coordination (not separately reported)         Current Medications (including today's revisions)  ? acetaminophen (TYLENOL) 500 mg tablet Take 500 mg by mouth every 6 hours as needed for Pain. Max of 4,000 mg of acetaminophen in 24 hours.   ? albuterol (PROAIR HFA, VENTOLIN HFA, OR PROVENTIL HFA) 90 mcg/actuation inhaler Inhale 2 puffs by mouth into the lungs every 6 hours as needed for Wheezing or Shortness of Breath. Shake well before use.   ? allopurinoL (ZYLOPRIM) 300 mg tablet Take 1 tablet by mouth daily.   ? aspirin EC 81 mg tablet Take 1 tablet by mouth daily. Take with food.   ? BROVANA 15 mcg/2 mL nebulizer solution Inhale  solution by nebulizer as directed twice daily.   ? budesonide (PULMICORT) 1 mg/2 mL nebulizer solution Inhale  solution by nebulizer as directed twice daily.   ? carvediloL (COREG) 25 mg tablet TAKE 1 TABLET BY MOUTH TWICE A DAY   ? chlorthalidone (HYGROTON) 25 mg tablet TAKE 1 TABLET BY MOUTH EVERY DAY   ? Cholecalciferol (VITAMIN D3) 125 mcg (5,000 unit) capsule Take 5,000 Units by mouth daily.   ? colchicine 0.6 mg tablet Take 0.6 mg by mouth daily.   ? diclofenac sodium 2 % sopk Apply  topically to affected area as Needed.   ? duloxetine DR (CYMBALTA) 60 mg capsule Take 60 mg by mouth daily.   ? gabapentin (NEURONTIN)  300 mg capsule Take 300 mg by mouth every 8 hours.   ? HYDROcodone/acetaminophen (NORCO) 5/325 mg tablet Take 0.5 tablets by mouth daily.   ? nitroglycerin (NITROSTAT) 0.4 mg tablet Take 1 tablet sublingually PRN for CP, take 3 thablets 5 min appart, if pain does not go away then call 911. ? olmesartan (BENICAR) 20 mg tablet TAKE 1 TABLET BY MOUTH TWICE A DAY   ? omeprazole DR(+) (PRILOSEC) 40 mg capsule Take 40 mg by mouth daily.   ? simvastatin (ZOCOR) 40 mg tablet TAKE 1 TABLET BY MOUTH AT BEDTIME   ? tamsulosin (FLOMAX) 0.4 mg capsule Take 1 capsule by mouth daily.   ? terbinafine (LAMISIL) 250 mg tablet Take 250 mg by mouth daily.   ? Zinc Gluconate 50 mg tab Take 1 tablet by mouth daily.

## 2021-07-28 IMAGING — CT STONE PROTOCOL(Adult)
2 of 3 series · 13 of 46 positions shown, 15 images · non-contrast
Comparison: none

[Series 2: abdomen ax 3.00 br40 s3 · axial · 0.74mm/px · z∈[+1290,+1706]mm · 10 of 159 slices shown, 12 images]
[im 11/159  soft-tissue]
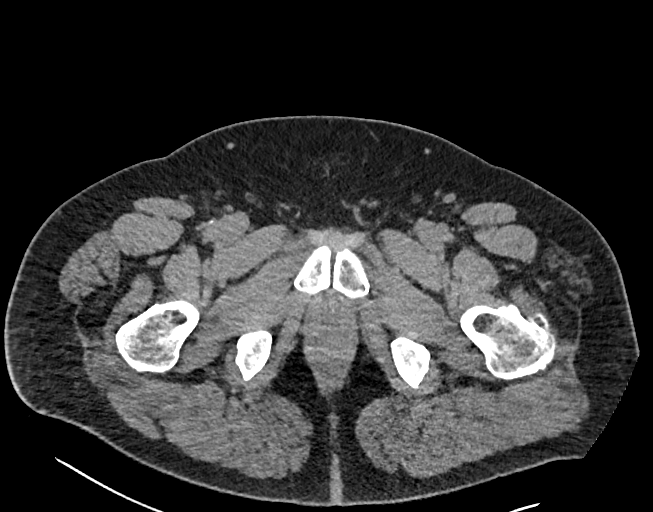
[im 11/159  bone]
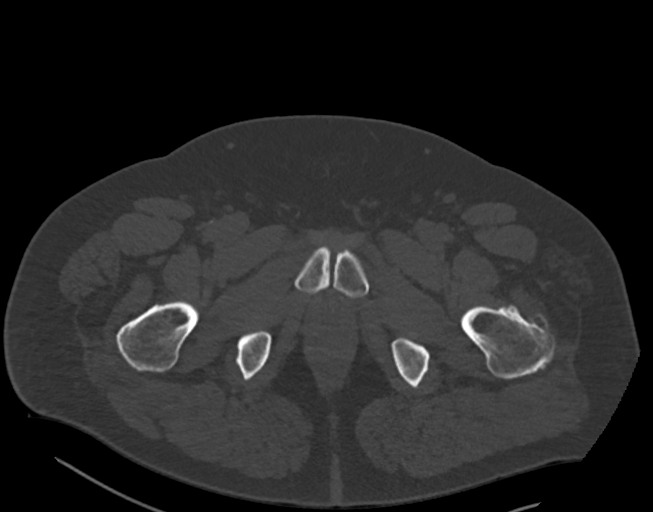
[im 26/159  soft-tissue]
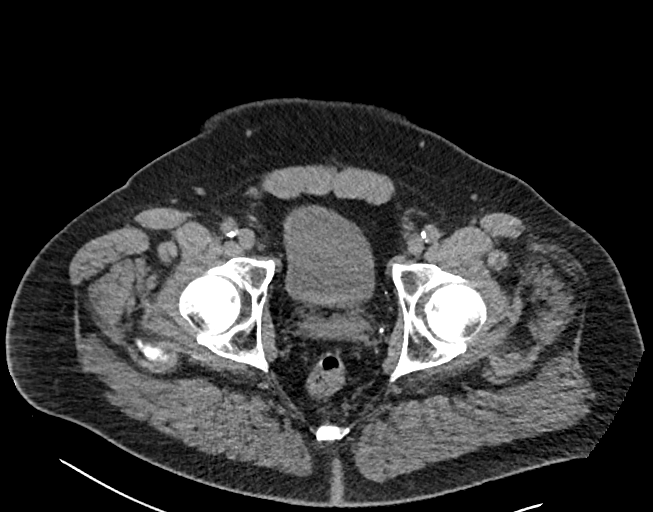
[im 41/159  soft-tissue]
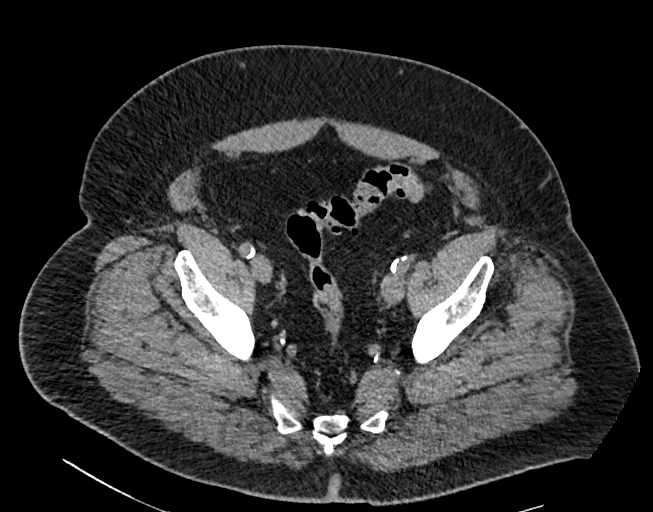
[im 57/159  soft-tissue]
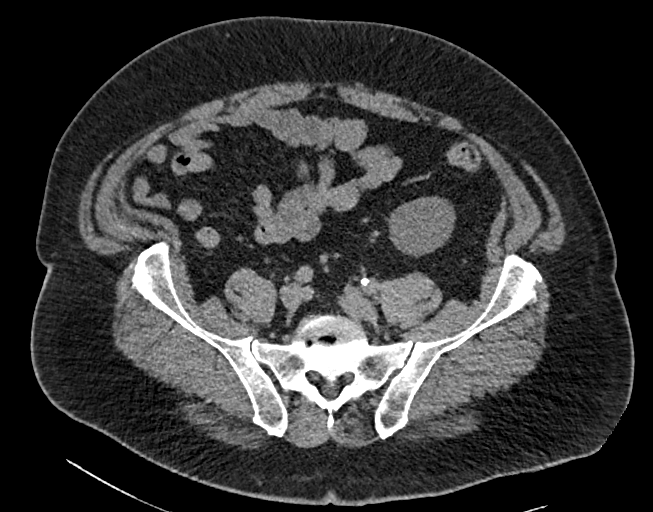
[im 72/159  soft-tissue]
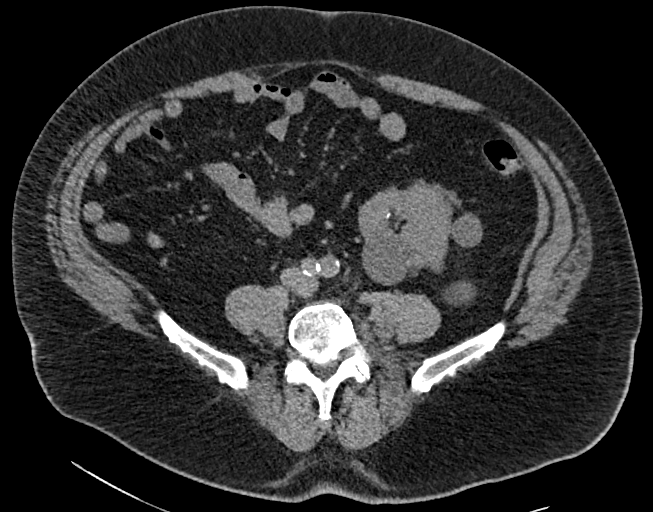
[im 87/159  soft-tissue]
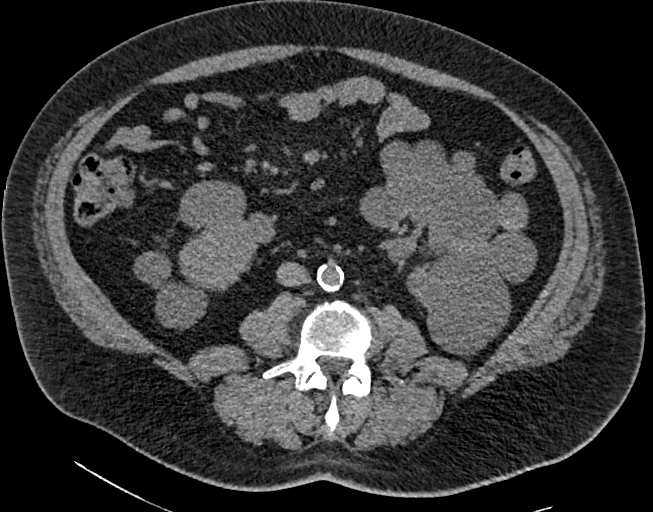
[im 102/159  soft-tissue]
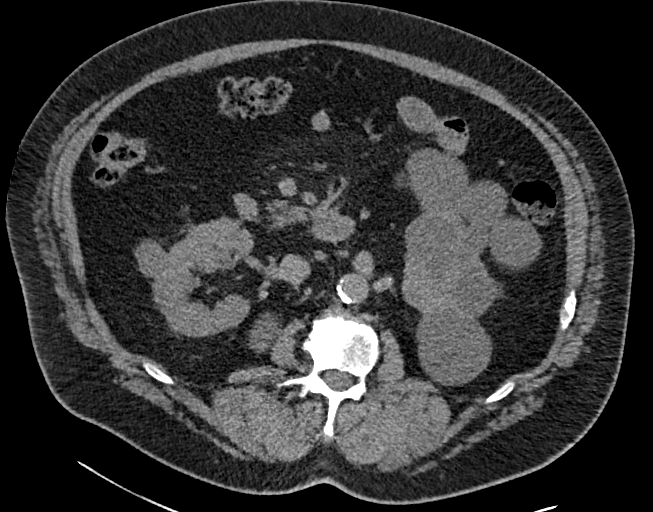
[im 118/159  soft-tissue]
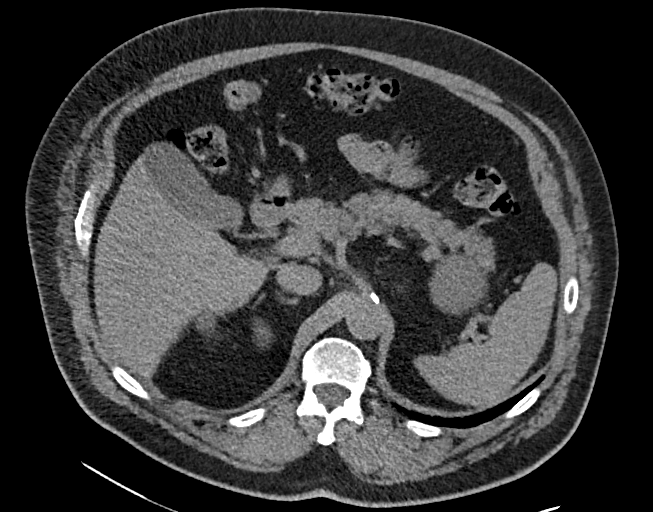
[im 133/159  soft-tissue]
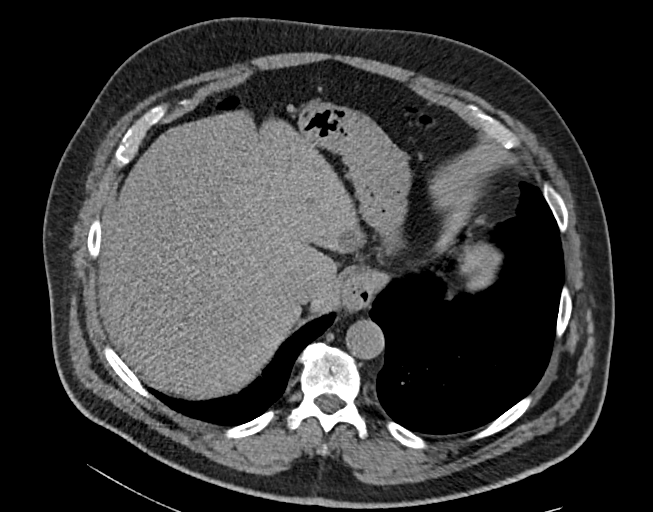
[im 133/159  bone]
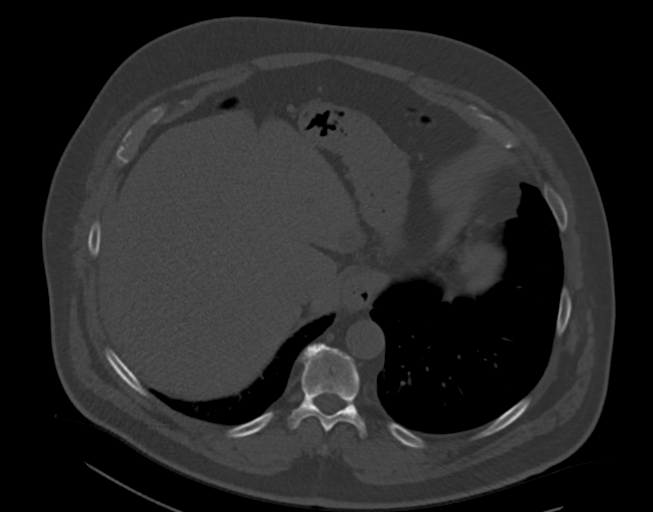
[im 148/159  soft-tissue]
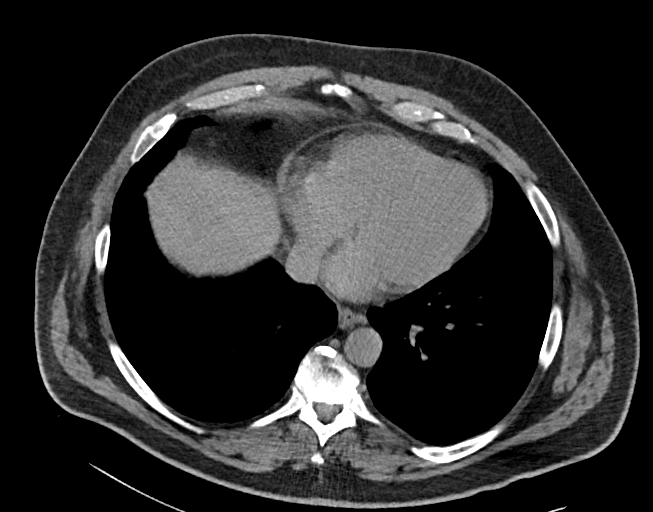

[Series 4: abdomen cor 3.00 br40 s3 · coronal · 0.95mm/px · 3 of 125 slices shown]
[im 42/125  soft-tissue]
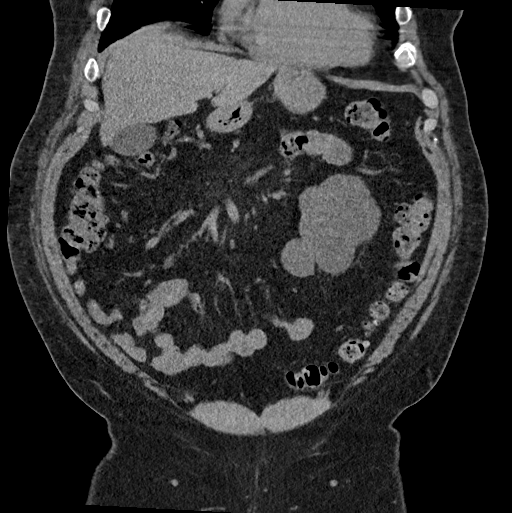
[im 56/125  soft-tissue]
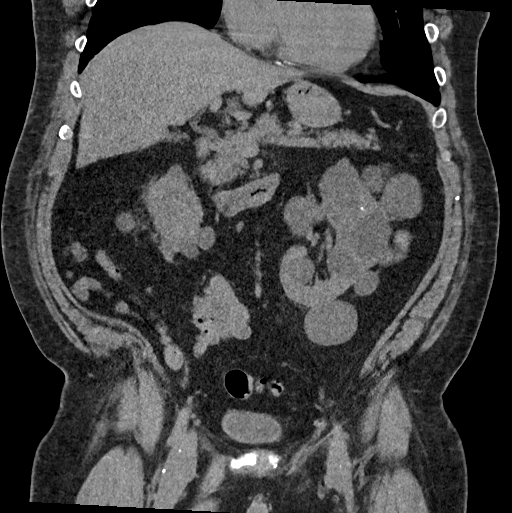
[im 69/125  soft-tissue]
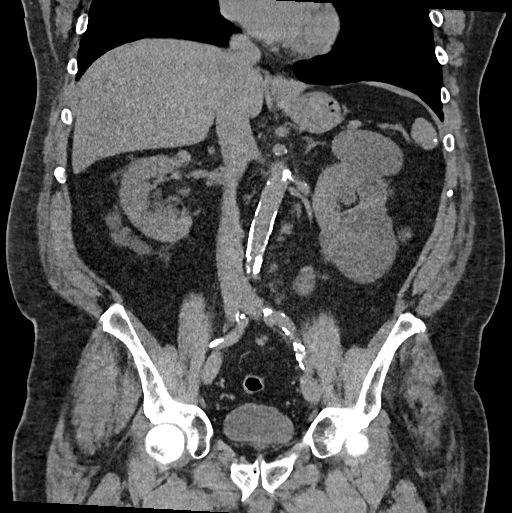

[13 of 46 positions shown; findings below may reference images not displayed]

DIAGNOSTIC STUDIES

EXAM

CT abdomen and pelvis without contrast

INDICATION

RLQ pain, R/O stone on hernia
RT SIDED GROIN PAIN, DYSURIA, PAIN IN TO BACK.  HX RENAL STONES, HX CYSTIC RENAL DISEASE CT/NM [DATE]
RG/TJ

TECHNIQUE

Volumetric multidetector CT images of the abdomen and pelvis were obtained without administration
of intravenous contrast.

All CT scans at this facility use dose modulation, iterative reconstruction, and/or weight based
dosing when appropriate to reduce radiation dose to as low as reasonably achievable.

In the last twelve months the patient has had 0 computed tomographic studies

In the last twelve months the patient has had 0 nuclear cardiac perfusion studies

COMPARISONS

CT abdomen pelvis January 19, 2019.

FINDINGS

The visualized lung bases  are clear . The  liver, spleen, and gallbladder are grossly stable.
There is re-demonstration of adenoma within the right adrenal gland.. The pancreas  is unremarkable.
There is no epigastric, para-aoritc, mesenteric, or pelvic adenopathy. The aorta  is non aneurysmal
with moderate scattered atherosclerotic calcification. The kidneys  demonstrate extensive cystic
changes of the bilateral left greater than right kidneys commensurate with polycystic kidney
disease. There is no evidence of hydronephrosis or hydroureter.. The distal colon demonstrates
mild-to-moderate stool. The appendix  is normal.  The remaining hollow and solid abdominal and
pelvic viscera are grossly unremarkable. The lumbar spine demonstrates  moderate multilevel
degenerative disc disease with disc height loss and marginal osteophyte formation. There is no
evidence of acute osseous abnormality. Degenerative changes of the bilateral hips are appreciated.

IMPRESSION

1. Re-demonstration of extensive polycystic kidneys without evidence of obstructive radiopaque
calculus. No definite acute intra-abdominal abnormality. Normal appendix is appreciated..

Tech Notes:

RT SIDED GROIN PAIN, DYSURIA, PAIN IN TO BACK.  HX RENAL STONES, HX CYSTIC RENAL DISEASE CT/NM [DATE]
RG/TJ

## 2021-08-02 ENCOUNTER — Encounter: Admit: 2021-08-02 | Discharge: 2021-08-02 | Payer: MEDICARE

## 2021-08-02 MED ORDER — OLMESARTAN 20 MG PO TAB
ORAL_TABLET | Freq: Two times a day (BID) | 3 refills
Start: 2021-08-02 — End: ?

## 2021-08-02 MED ORDER — SIMVASTATIN 40 MG PO TAB
ORAL_TABLET | Freq: Every evening | 3 refills
Start: 2021-08-02 — End: ?

## 2021-09-10 ENCOUNTER — Encounter: Admit: 2021-09-10 | Discharge: 2021-09-10 | Payer: MEDICARE

## 2021-09-10 ENCOUNTER — Ambulatory Visit: Admit: 2021-09-10 | Discharge: 2021-09-10 | Payer: MEDICARE

## 2021-09-10 DIAGNOSIS — I25119 Atherosclerotic heart disease of native coronary artery with unspecified angina pectoris: Secondary | ICD-10-CM

## 2021-09-10 DIAGNOSIS — I10 Essential (primary) hypertension: Secondary | ICD-10-CM

## 2021-09-10 DIAGNOSIS — E6609 Other obesity due to excess calories: Secondary | ICD-10-CM

## 2021-09-10 DIAGNOSIS — I4892 Unspecified atrial flutter: Secondary | ICD-10-CM

## 2021-09-15 ENCOUNTER — Encounter: Admit: 2021-09-15 | Discharge: 2021-09-15 | Payer: MEDICARE

## 2021-09-15 NOTE — Telephone Encounter
-----   Message from Dorris Fetch, MD sent at 09/14/2021  4:32 PM CDT -----  Please let the patient know that the stress test did not show any new blockages, normal function.    Thank you      ----- Message -----  From: Levora Angel, MD  Sent: 09/12/2021   9:48 AM CDT  To: Dorris Fetch, MD

## 2021-09-15 NOTE — Telephone Encounter
Left vm with test results and call back number for any questions.

## 2022-03-03 ENCOUNTER — Encounter: Admit: 2022-03-03 | Discharge: 2022-03-03 | Payer: MEDICARE

## 2022-04-02 ENCOUNTER — Encounter: Admit: 2022-04-02 | Discharge: 2022-04-02 | Payer: MEDICARE

## 2022-04-02 MED ORDER — CARVEDILOL 25 MG PO TAB
ORAL_TABLET | ORAL | 1 refills | 90.00000 days | Status: AC
Start: 2022-04-02 — End: ?

## 2022-04-26 ENCOUNTER — Encounter: Admit: 2022-04-26 | Discharge: 2022-04-26 | Payer: MEDICARE

## 2022-04-26 MED ORDER — CHLORTHALIDONE 25 MG PO TAB
ORAL_TABLET | 3 refills
Start: 2022-04-26 — End: ?

## 2022-04-27 ENCOUNTER — Encounter: Admit: 2022-04-27 | Discharge: 2022-04-27 | Payer: MEDICARE

## 2022-05-12 ENCOUNTER — Encounter: Admit: 2022-05-12 | Discharge: 2022-05-12 | Payer: MEDICARE

## 2022-05-14 ENCOUNTER — Encounter: Admit: 2022-05-14 | Discharge: 2022-05-14 | Payer: MEDICARE

## 2022-05-14 DIAGNOSIS — R079 Chest pain, unspecified: Secondary | ICD-10-CM

## 2022-05-14 DIAGNOSIS — R0600 Dyspnea, unspecified: Secondary | ICD-10-CM

## 2022-05-14 DIAGNOSIS — E6609 Other obesity due to excess calories: Secondary | ICD-10-CM

## 2022-05-14 DIAGNOSIS — J438 Other emphysema: Secondary | ICD-10-CM

## 2022-05-14 DIAGNOSIS — G4733 Obstructive sleep apnea (adult) (pediatric): Secondary | ICD-10-CM

## 2022-05-14 DIAGNOSIS — Z87891 Personal history of nicotine dependence: Secondary | ICD-10-CM

## 2022-05-14 DIAGNOSIS — I25119 Atherosclerotic heart disease of native coronary artery with unspecified angina pectoris: Secondary | ICD-10-CM

## 2022-05-14 DIAGNOSIS — R9439 Abnormal result of other cardiovascular function study: Secondary | ICD-10-CM

## 2022-05-14 DIAGNOSIS — I483 Typical atrial flutter: Secondary | ICD-10-CM

## 2022-05-14 DIAGNOSIS — Z6841 Body Mass Index (BMI) 40.0 and over, adult: Secondary | ICD-10-CM

## 2022-05-14 DIAGNOSIS — Z136 Encounter for screening for cardiovascular disorders: Secondary | ICD-10-CM

## 2022-05-14 DIAGNOSIS — I10 Essential (primary) hypertension: Secondary | ICD-10-CM

## 2022-05-14 DIAGNOSIS — R0602 Shortness of breath: Secondary | ICD-10-CM

## 2022-05-14 DIAGNOSIS — Z789 Other specified health status: Secondary | ICD-10-CM

## 2022-05-14 DIAGNOSIS — U071 Upper respiratory tract infection due to COVID-19 virus: Secondary | ICD-10-CM

## 2022-05-14 DIAGNOSIS — H3530 Unspecified macular degeneration: Secondary | ICD-10-CM

## 2022-05-14 DIAGNOSIS — W19XXXA Unspecified fall, initial encounter: Secondary | ICD-10-CM

## 2022-05-14 DIAGNOSIS — I4892 Unspecified atrial flutter: Secondary | ICD-10-CM

## 2022-05-20 ENCOUNTER — Encounter: Admit: 2022-05-20 | Discharge: 2022-05-20 | Payer: MEDICARE

## 2022-05-20 ENCOUNTER — Ambulatory Visit: Admit: 2022-05-20 | Discharge: 2022-05-20 | Payer: MEDICARE

## 2022-05-20 DIAGNOSIS — I10 Essential (primary) hypertension: Secondary | ICD-10-CM

## 2022-05-20 DIAGNOSIS — I4892 Unspecified atrial flutter: Secondary | ICD-10-CM

## 2022-05-20 DIAGNOSIS — R079 Chest pain, unspecified: Secondary | ICD-10-CM

## 2022-05-20 DIAGNOSIS — I2510 Atherosclerotic heart disease of native coronary artery without angina pectoris: Secondary | ICD-10-CM

## 2022-05-20 DIAGNOSIS — R0602 Shortness of breath: Secondary | ICD-10-CM

## 2022-05-22 ENCOUNTER — Encounter: Admit: 2022-05-22 | Discharge: 2022-05-22 | Payer: MEDICARE

## 2022-05-22 DIAGNOSIS — I10 Essential (primary) hypertension: Secondary | ICD-10-CM

## 2022-05-22 DIAGNOSIS — I483 Typical atrial flutter: Secondary | ICD-10-CM

## 2022-05-22 DIAGNOSIS — R9439 Abnormal result of other cardiovascular function study: Secondary | ICD-10-CM

## 2022-05-22 NOTE — Telephone Encounter
-----   Message from Dorris Fetch, MD sent at 05/21/2022  9:35 PM CDT -----  Please let the patient know that the echocardiogram showed normal heart function and no valvular abnormalities.  Follow-up in 1 year from the last office visit.    Thank you      ----- Message -----  From: Jesse Sans, MD  Sent: 05/21/2022  12:00 PM CDT  To: Dorris Fetch, MD

## 2022-05-22 NOTE — Telephone Encounter
Results and recommendations called to patient lmom requested call back if questions

## 2022-10-05 ENCOUNTER — Encounter: Admit: 2022-10-05 | Discharge: 2022-10-05 | Payer: MEDICARE

## 2022-10-05 MED ORDER — SIMVASTATIN 40 MG PO TAB
40 mg | ORAL_TABLET | Freq: Every evening | ORAL | 3 refills | Status: AC
Start: 2022-10-05 — End: ?

## 2022-12-30 ENCOUNTER — Encounter: Admit: 2022-12-30 | Discharge: 2022-12-30 | Payer: MEDICARE

## 2023-04-26 ENCOUNTER — Encounter: Admit: 2023-04-26 | Discharge: 2023-04-26 | Payer: MEDICARE

## 2023-05-06 ENCOUNTER — Encounter: Admit: 2023-05-06 | Discharge: 2023-05-06 | Payer: MEDICARE

## 2023-05-06 DIAGNOSIS — I483 Typical atrial flutter: Secondary | ICD-10-CM

## 2023-05-06 DIAGNOSIS — U071 Upper respiratory tract infection due to COVID-19 virus: Secondary | ICD-10-CM

## 2023-05-06 DIAGNOSIS — I10 Essential (primary) hypertension: Secondary | ICD-10-CM

## 2023-05-06 DIAGNOSIS — R9439 Abnormal result of other cardiovascular function study: Secondary | ICD-10-CM

## 2023-05-06 DIAGNOSIS — Z23 Encounter for immunization: Secondary | ICD-10-CM

## 2023-05-06 DIAGNOSIS — I4892 Unspecified atrial flutter: Secondary | ICD-10-CM

## 2023-05-06 DIAGNOSIS — R0989 Other specified symptoms and signs involving the circulatory and respiratory systems: Secondary | ICD-10-CM

## 2023-05-06 DIAGNOSIS — R079 Chest pain, unspecified: Secondary | ICD-10-CM

## 2023-05-06 DIAGNOSIS — E6609 Other obesity due to excess calories: Secondary | ICD-10-CM

## 2023-05-06 DIAGNOSIS — I25119 Atherosclerotic heart disease of native coronary artery with unspecified angina pectoris: Secondary | ICD-10-CM

## 2023-05-06 DIAGNOSIS — E7800 Pure hypercholesterolemia, unspecified: Secondary | ICD-10-CM

## 2023-05-06 DIAGNOSIS — R0600 Dyspnea, unspecified: Secondary | ICD-10-CM

## 2023-05-06 DIAGNOSIS — Z6841 Body Mass Index (BMI) 40.0 and over, adult: Secondary | ICD-10-CM

## 2023-05-06 DIAGNOSIS — G4733 Obstructive sleep apnea (adult) (pediatric): Secondary | ICD-10-CM

## 2023-05-06 DIAGNOSIS — Z87891 Personal history of nicotine dependence: Secondary | ICD-10-CM

## 2023-05-06 DIAGNOSIS — J438 Other emphysema: Secondary | ICD-10-CM

## 2023-05-06 DIAGNOSIS — Z01810 Encounter for preprocedural cardiovascular examination: Secondary | ICD-10-CM

## 2023-05-06 DIAGNOSIS — R0602 Shortness of breath: Secondary | ICD-10-CM

## 2023-05-06 NOTE — Progress Notes
Date of Service: 05/06/2023    Dylan Cox is a 72 y.o. male.       HPI      Dylan Cox is a 72 y.o. 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    Patient has not experienced chest pain, has not taken any sublingual nitroglycerin.  His overall level of physical activity is decreased due to multiple comorbidities that consist of chronic back pain, morbid obesity, macular degeneration with subsequent blindness, lack of physical activity and sedentary lifestyle.    Patient underwent evaluation with the following:    1.  Perfusion imaging study performed in October 2022-this study did not demonstrate ischemia, ECG portion of the study was negative for ischemia as well, LVEF 51%.  2.  2D echo Doppler study dated 05/20/2022-normal LVEF = 55%, the RV appears mildly dilated with normal function, no significant cardiac valves abnormalities.             Vitals:    05/06/23 0835   BP: (!) 142/93   BP Source: Arm, Left Upper   Pulse: 77   SpO2: 93%   O2 Device: None (Room air)   PainSc: One   Weight: (!) 143.1 kg (315 lb 6.4 oz)   Height: 182.9 cm (6')     Body mass index is 42.78 kg/m?Marland Kitchen     Past Medical History  Patient Active Problem List    Diagnosis Date Noted    Body mass index (BMI) 40.0-44.9, adult (HCC) 05/14/2022    Upper respiratory tract infection due to COVID-19 virus 06/05/2021    History of tobacco use 11/07/2020    Other emphysema (HCC) 11/07/2020    Fall 04/11/2020    COVID-19 vaccine administered 04/11/2020    Bilateral leg edema 04/11/2020    Weight gain 04/11/2020    Macular degeneration of left eye 10/12/2019    Weight gain 01/24/2019    Weight loss advised 01/24/2019    Preop cardiovascular exam 11/11/2017    On clopidogrel therapy 11/11/2017    Abnormal stress test 06/03/2017    Coronary artery disease involving native coronary artery of native heart with angina pectoris (HCC) 06/02/2017     06/02/2017 - mildly abnormal stress test. presented for cath. DES x1 to 80-90% Ramus and DES x 1 to 80-90% PDA       Abnormal thallium stress test 05/27/2017     Added automatically from request for surgery [id number]      Dyspnea 05/27/2017     Added automatically from request for surgery [id number]      Class 2 obesity due to excess calories without serious comorbidity with body mass index (BMI) of 38.0 to 38.9 in adult 05/27/2017    OSA (obstructive sleep apnea) 05/27/2017    Shortness of breath 05/17/2017    Hypertension 05/17/2017    Chest pain 05/17/2017     1. Holter - 05/05/17 at [facility] - 1. Normal 24 hour holter revealing NSR and no arrhythmias.        Atrial flutter (HCC) 05/17/2017  Review of Systems   Constitutional: Negative.   HENT: Negative.     Eyes: Negative.    Cardiovascular:  Positive for dyspnea on exertion and leg swelling.   Respiratory: Negative.     Endocrine: Negative.    Hematologic/Lymphatic: Negative.    Skin: Negative.    Musculoskeletal:  Positive for back pain, joint pain and neck pain.   Gastrointestinal: Negative.    Genitourinary: Negative.    Neurological: Negative.    Psychiatric/Behavioral: Negative.     Allergic/Immunologic: Negative.        Physical Exam  General Appearance: obese, BMI = 42.78 kg/m?  Skin: warm, moist, no ulcers or xanthomas  Eyes: conjunctivae and lids normal, pupils are equal and round  Lips & Oral Mucosa: no pallor or cyanosis  Neck Veins: neck veins are flat, neck veins are not distended  Chest Inspection: chest is normal in appearance  Respiratory Effort: breathing comfortably, no respiratory distress  Auscultation/Percussion: lungs clear to auscultation, no rales or rhonchi, no wheezing  Cardiac Rhythm: regular rhythm and normal rate  Cardiac Auscultation: S1, S2 normal, no rub, no gallop  Murmurs: no murmur  Carotid Arteries: normal carotid upstroke bilaterally, no bruit  Abdominal aorta: could not be examined due to obese adomen  Lower Extremity Edema: no lower extremity edema  Abdominal Exam: soft, non-tender, no masses, bowel sounds normal  Liver & Spleen: no organomegaly  Language and Memory: patient responsive and seems to comprehend information  Neurologic Exam: neurological assessment grossly intact      Cardiovascular Studies  Twelve-lead EKG demonstrates normal sinus rhythm, ventricular rate 72 bpm, no axis deviation, no ST segment T wave changes.    Cardiovascular Health Factors  Vitals BP Readings from Last 3 Encounters:   05/06/23 (!) 142/93   05/20/22 124/68   05/14/22 132/70     Wt Readings from Last 3 Encounters:   05/06/23 (!) 143.1 kg (315 lb 6.4 oz)   05/20/22 (!) 142.9 kg (315 lb)   05/14/22 (!) 144.9 kg (319 lb 6.4 oz)     BMI Readings from Last 3 Encounters:   05/06/23 42.78 kg/m?   05/20/22 42.72 kg/m?   05/14/22 43.32 kg/m?      Smoking Social History     Tobacco Use   Smoking Status Former    Current packs/day: 0.00    Types: Cigarettes    Quit date: 05/27/2002    Years since quitting: 20.9   Smokeless Tobacco Never      Lipid Profile Cholesterol   Date Value Ref Range Status   09/07/2022 118  Final     HDL   Date Value Ref Range Status   09/07/2022 44  Final     LDL   Date Value Ref Range Status   09/07/2022 44  Final     Triglycerides   Date Value Ref Range Status   09/07/2022 151 (H) <150 Final      Blood Sugar Hemoglobin A1C   Date Value Ref Range Status   04/22/2017 5.8  Final     Glucose   Date Value Ref Range Status   09/07/2022 124 (H) 70 - 105 Final   04/03/2022 114 (H) 70 - 105 Final   02/19/2022 106 (H) 70 - 105 Final          Problems Addressed Today  Encounter Diagnoses   Name Primary?    Primary hypertension Yes    Coronary artery disease involving native coronary artery of native heart with angina  pectoris (HCC)     Typical atrial flutter (HCC)     Upper respiratory tract infection due to COVID-19 virus     Shortness of breath     Other emphysema (HCC)     OSA (obstructive sleep apnea)     History of tobacco use     COVID-19 vaccine administered     Class 2 obesity due to excess calories without serious comorbidity with body mass index (BMI) of 38.0 to 38.9 in adult     Abnormal thallium stress test     Abnormal stress test     Cardiovascular symptoms     Body mass index (BMI) 40.0-44.9, adult (HCC)     Encounter for pre-operative cardiovascular clearance     Pure hypercholesterolemia        Assessment and Plan     Assessment:    1.  Preoperative cardiovascular evaluation preceding anticipated left shoulder surgery  Procedure has not been scheduled yet, patient will be evaluated by Dr. Para March  2.  Coronary artery disease-stable, no symptoms of angina and no use of sublingual nitroglycerin  Most recent ischemic evaluation performed in October 2022 with a perfusion imaging study did not demonstrate ischemia, normal LVEF, no high risk features were present on that study  3.  Status post PCI of the ramus intermedius and PDA in July 2018  4.  Primary hypertension-under fair control  5.  Morbid obesity, elevated BMI = 42.78 kg/m?  6.  Hyperlipidemia-on statin therapy  7.  OSA using CPAP machine  8.  History of tobacco use with subsequent COPD-patient uses inhalers as needed  9.  History of COVID-19 upper respiratory tract infection  Patient did receive monoclonal antibodies as well as thyroid treatment, he recovered well  10.  Progressive macular degeneration-patient is legally blind    Plan:    1.  Continue all current cardiac medications  2.  I recommended a strict low-sodium diet  3. Patient's Revised Cardiac Risk Index  is low for a low risk surgery.   Estimated risk of adverse outcomes (perioperative/postoperative events including myocardial infarction, pulmonary edema, ventricular fibrillation, cardiac arrest or complete heart block) with noncardiac surgery is  ~ 1%  from a cardiac standpoint. This patient does not have any contraindication to undergo general anesthesia and the planned surgical procedure.  The full risks and benefits will have to be discussed with the Anesthesiologist  and the Surgeon that will perform the procedure.   Please contact us for any further questions.      Total Time Today was 40 minutes in the following activities: Preparing to see the patient, Obtaining and/or reviewing separately obtained history, Performing a medically appropriate examination and/or evaluation, Counseling and educating the patient/family/caregiver, Ordering medications, tests, or procedures, Referring and communication with other health care professionals (when not separately reported), Documenting clinical information in the electronic or other health record, Independently interpreting results (not separately reported) and communicating results to the patient/family/caregiver, and Care coordination (not separately reported)            Current Medications (including today's revisions)   acetaminophen (TYLENOL) 500 mg tablet Take one tablet by mouth every 6 hours as needed for Pain. Max of 4,000 mg of acetaminophen in 24 hours.    albuterol (PROAIR HFA, VENTOLIN HFA, OR PROVENTIL HFA) 90 mcg/actuation inhaler Inhale two puffs by mouth into the lungs every 6 hours as needed for Wheezing or Shortness of Breath. Shake well before use.    albuterol-ipratropium (DUONEB) 0.5  mg-3 mg(2.5 mg base)/3 mL nebulizer solution Inhale 3 mL solution by nebulizer as directed four times daily.    allopurinoL (ZYLOPRIM) 300 mg tablet Take one tablet by mouth daily.    ARNUITY ELLIPTA 100 mcg/actuation disk inhaler Inhale one puff by mouth into the lungs daily.    aspirin EC 81 mg tablet Take 1 tablet by mouth daily. Take with food.    BROVANA 15 mcg/2 mL nebulizer solution Inhale  solution by nebulizer as directed twice daily.    budesonide (PULMICORT) 1 mg/2 mL nebulizer solution Inhale  solution by nebulizer as directed twice daily.    carvediloL (COREG) 12.5 mg tablet Take one tablet by mouth twice daily.    Cholecalciferol (VITAMIN D3) 125 mcg (5,000 unit) capsule Take one capsule by mouth daily.    diclofenac sodium 2 % sopk Apply  topically to affected area as Needed.    duloxetine DR (CYMBALTA) 60 mg capsule Take one capsule by mouth daily.    gabapentin (NEURONTIN) 300 mg capsule Take one capsule by mouth every 8 hours.    HYDROcodone/acetaminophen (NORCO) 7.5/325 mg tablet Take one tablet by mouth every 8 hours as needed for Pain.    MULTIVITAMIN PO Take 1 tablet by mouth daily.    nitroglycerin (NITROSTAT) 0.4 mg tablet Take 1 tablet sublingually PRN for CP, take 3 thablets 5 min appart, if pain does not go away then call 911.    olmesartan (BENICAR) 20 mg tablet TAKE 1 TABLET BY MOUTH TWICE A DAY (Patient taking differently: Take one tablet by mouth daily.)    omeprazole DR(+) (PRILOSEC) 40 mg capsule Take one capsule by mouth daily.    potassium chloride (K-TAB) 20 mEq tablet Take 10 mEq by mouth daily.    simvastatin (ZOCOR) 40 mg tablet TAKE 1 TABLET BY MOUTH EVERYDAY AT BEDTIME    tamsulosin (FLOMAX) 0.4 mg capsule Take one capsule by mouth daily.    torsemide (DEMADEX) 20 mg tablet Take one tablet by mouth daily.    triamcinolone acetonide (KENALOG) 0.1 % topical cream Apply  topically to affected area twice daily.

## 2023-08-28 ENCOUNTER — Encounter: Admit: 2023-08-28 | Discharge: 2023-08-28 | Payer: MEDICARE

## 2023-08-28 MED ORDER — SIMVASTATIN 40 MG PO TAB
40 mg | ORAL_TABLET | Freq: Every evening | ORAL | 3 refills
Start: 2023-08-28 — End: ?

## 2024-02-26 ENCOUNTER — Encounter: Admit: 2024-02-26 | Discharge: 2024-02-26

## 2024-08-24 ENCOUNTER — Encounter: Admit: 2024-08-24 | Discharge: 2024-08-24 | Payer: MEDICARE

## 2024-08-24 ENCOUNTER — Ambulatory Visit: Admit: 2024-08-24 | Discharge: 2024-08-24 | Payer: MEDICARE

## 2024-08-24 VITALS — BP 130/72 | HR 72 | Ht 72.0 in | Wt 320.6 lb

## 2024-08-24 DIAGNOSIS — H3530 Unspecified macular degeneration: Secondary | ICD-10-CM

## 2024-08-24 DIAGNOSIS — Z23 Encounter for immunization: Secondary | ICD-10-CM

## 2024-08-24 DIAGNOSIS — U071 Upper respiratory tract infection due to COVID-19 virus: Secondary | ICD-10-CM

## 2024-08-24 DIAGNOSIS — J438 Other emphysema: Secondary | ICD-10-CM

## 2024-08-24 DIAGNOSIS — Z6841 Body Mass Index (BMI) 40.0 and over, adult: Secondary | ICD-10-CM

## 2024-08-24 DIAGNOSIS — R0602 Shortness of breath: Secondary | ICD-10-CM

## 2024-08-24 DIAGNOSIS — I10 Essential (primary) hypertension: Principal | ICD-10-CM

## 2024-08-24 DIAGNOSIS — Z87891 Personal history of nicotine dependence: Secondary | ICD-10-CM

## 2024-08-24 DIAGNOSIS — Z789 Other specified health status: Secondary | ICD-10-CM

## 2024-08-24 DIAGNOSIS — R600 Localized edema: Secondary | ICD-10-CM

## 2024-08-24 DIAGNOSIS — Z136 Encounter for screening for cardiovascular disorders: Secondary | ICD-10-CM

## 2024-08-24 DIAGNOSIS — G4733 Obstructive sleep apnea (adult) (pediatric): Secondary | ICD-10-CM

## 2024-08-24 DIAGNOSIS — E66812 Class 2 obesity due to excess calories without serious comorbidity with body mass index (BMI) of 38.0 to 38.9 in adult: Secondary | ICD-10-CM

## 2024-08-24 DIAGNOSIS — I25119 Atherosclerotic heart disease of native coronary artery with unspecified angina pectoris: Secondary | ICD-10-CM

## 2024-08-24 DIAGNOSIS — I483 Typical atrial flutter: Secondary | ICD-10-CM

## 2024-08-24 NOTE — Progress Notes
 Date of Service: 08/24/2024    Dylan Cox is a 73 y.o. male.       HPI      Dylan Cox is a 73 y.o. 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    Due to morbid obesity, BMI = 43.48 kg/m? COPD and physical deconditioning patient's overall physical abilities are greatly reduced.  He has been experiencing oxygen desaturation at rest and with physical activity.  Patient's wife states that the oxygen sat was around 89% and she took him to the hospital, he was diagnosed with COVID 19 viral pneumonia (I believe this event occurred in February 2025).    From a cardiac standpoint he remains stable without symptoms of chest pain, no heart palpitations, no use of sublingual nitroglycerin .    Patient underwent evaluation with the following:     1.  Perfusion imaging study performed in October 2022-this study did not demonstrate ischemia, ECG portion of the study was negative for ischemia as well, LVEF 51%.  2.  2D echo Doppler study dated 05/20/2022-normal LVEF = 55%, the RV appears mildly dilated with normal function, no significant cardiac valves abnormalities.       Vitals:    08/24/24 1316   BP Source: Arm, Left Upper   O2 Device: None (Room air)   PainSc: Zero   Height: 182.9 cm (6')     Body mass index is 42.78 kg/m?SABRA     Past Medical History  Patient Active Problem List    Diagnosis Date Noted    Encounter for pre-operative cardiovascular clearance 05/06/2023    Body mass index (BMI) 40.0-44.9, adult (CMS-HCC) 05/14/2022    Upper respiratory tract infection due to COVID-19 virus 06/05/2021    History of tobacco use 11/07/2020    Other emphysema (CMS-HCC) 11/07/2020    Fall 04/11/2020    COVID-19 vaccine administered 04/11/2020    Bilateral leg edema 04/11/2020    Weight gain 04/11/2020    Macular degeneration of left eye 10/12/2019    Weight gain 01/24/2019    Weight loss advised 01/24/2019    Preop cardiovascular exam 11/11/2017    On clopidogrel  therapy 11/11/2017    Coronary artery disease involving native coronary artery of native heart with angina pectoris 06/02/2017     06/02/2017 - mildly abnormal stress test. presented for cath. DES x1 to 80-90% Ramus and DES x 1 to 80-90% PDA       Abnormal thallium stress test 05/27/2017     Added automatically from request for surgery [id number]      Dyspnea 05/27/2017     Added automatically from request for surgery [id number]      Class 2 obesity due to excess calories without serious comorbidity with body mass index (BMI) of 38.0 to 38.9 in adult 05/27/2017    OSA (obstructive sleep apnea) 05/27/2017    Shortness of breath 05/17/2017    Hypertension 05/17/2017    Chest pain 05/17/2017     1. Holter - 05/05/17 at [facility] - 1. Normal 24 hour holter revealing NSR and no arrhythmias.        Atrial flutter (CMS-HCC) 05/17/2017  Review of Systems   Constitutional: Negative.   HENT: Negative.     Eyes: Negative.    Cardiovascular: Negative.    Respiratory: Negative.     Endocrine: Negative.    Hematologic/Lymphatic: Negative.    Skin: Negative.    Musculoskeletal: Negative.    Gastrointestinal: Negative.    Genitourinary: Negative.    Neurological: Negative.    Psychiatric/Behavioral: Negative.     Allergic/Immunologic: Negative.        Physical Exam  General Appearance: obese, BMI = 43.48 kg/m?  Skin: warm, moist, no ulcers or xanthomas  Eyes: conjunctivae and lids normal, pupils are equal and round  Lips & Oral Mucosa: no pallor or cyanosis  Neck Veins: neck veins are flat, neck veins are not distended  Chest Inspection: chest is normal in appearance  Respiratory Effort: breathing comfortably, no respiratory distress  Auscultation/Percussion: lungs clear to auscultation, no rales or rhonchi, no wheezing  Cardiac Rhythm: regular rhythm and normal rate  Cardiac Auscultation: S1, S2 normal, no rub, no gallop  Murmurs: no murmur  Carotid Arteries: normal carotid upstroke bilaterally, no bruit  Abdominal aorta: could not be examined due to obese adomen  Lower Extremity Edema: no lower extremity edema  Abdominal Exam: soft, non-tender, no masses, bowel sounds normal  Liver & Spleen: no organomegaly  Language and Memory: patient responsive and seems to comprehend information  Neurologic Exam: neurological assessment grossly intact      Cardiovascular Studies  Twelve-lead EKG demonstrates normal sinus rhythm, ventricular rate 73 bpm, no axis deviation.    Cardiovascular Health Factors  Vitals BP Readings from Last 3 Encounters:   05/06/23 (!) 142/93   05/20/22 124/68   05/14/22 132/70     Wt Readings from Last 3 Encounters:   05/06/23 (!) 143.1 kg (315 lb 6.4 oz)   05/20/22 (!) 142.9 kg (315 lb)   05/14/22 (!) 144.9 kg (319 lb 6.4 oz)     BMI Readings from Last 3 Encounters:   08/24/24 42.78 kg/m?   05/06/23 42.78 kg/m?   05/20/22 42.72 kg/m?      Smoking Tobacco Use History[1]   Lipid Profile Cholesterol   Date Value Ref Range Status   09/07/2022 118  Final     HDL   Date Value Ref Range Status   09/07/2022 44  Final     LDL   Date Value Ref Range Status   09/07/2022 44  Final     Triglycerides   Date Value Ref Range Status   09/07/2022 151 (H) <150 Final      Blood Sugar Hemoglobin A1C   Date Value Ref Range Status   04/22/2017 5.8  Final     Glucose   Date Value Ref Range Status   09/07/2022 124 (H) 70 - 105 Final   04/03/2022 114 (H) 70 - 105 Final   02/19/2022 106 (H) 70 - 105 Final          Problems Addressed Today  Encounter Diagnoses   Name Primary?    Primary hypertension Yes    Coronary artery disease involving native coronary artery of native heart with angina pectoris     Typical atrial flutter (CMS-HCC) Upper respiratory tract infection due to COVID-19 virus     Shortness of breath     Other emphysema (CMS-HCC)     OSA (obstructive sleep apnea)     COVID-19 vaccine administered     Class 2 obesity due to excess calories without serious comorbidity with body mass  index (BMI) of 38.0 to 38.9 in adult     Bilateral leg edema     Screening for heart disease     Body mass index (BMI) 40.0-44.9, adult (CMS-HCC)     Weight loss advised     Macular degeneration of left eye, unspecified type     History of tobacco use     OSA on CPAP        Assessment and Plan     Assessment:     1.  CAD --no symptoms of angina and no use of sublingual nitroglycerin   2.  Status post PCI of the RI and PDA in July 2018  3.  History of COVID-19 viral upper respiratory tract infection/pneumonia x 3  Episodes of low oxygen have been noted by patient's wife  Patient's wife states that at home the oxygen saturation is around 92%  4.  OSA-using a CPAP machine  5.  History of tobacco use with subsequent COPD-patient continues on inhalers  6.  Morbid obesity, patient's BMI 43.48 kg/m?  7.  Progressive macular degeneration-patient is legally blind  8.  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  Overall poor functional status-multifactorial and ranging from morbid obesity, profound physical deconditioning, possible malnutrition, underlying advanced pulmonary disease    Plan:    1.  Continue all current cardiac medications  2.  I suggested to make an appointment with Dr. Quenten in the pulmonology department at [facility] to discuss further assessment and treatment of the underlying pulmonary condition and perhaps consideration of home oxygen therapy if patient will meet the criteria    Total Time Today was 30 minutes in the following activities: Preparing to see the patient, Obtaining and/or reviewing separately obtained history, Performing a medically appropriate examination and/or evaluation, Counseling and educating the patient/family/caregiver, Ordering medications, tests, or procedures, Referring and communication with other health care professionals (when not separately reported), Documenting clinical information in the electronic or other health record, Independently interpreting results (not separately reported) and communicating results to the patient/family/caregiver, and Care coordination (not separately reported)          Current Medications (including today's revisions)   acetaminophen  (TYLENOL ) 500 mg tablet Take one tablet by mouth every 6 hours as needed for Pain. Max of 4,000 mg of acetaminophen  in 24 hours.    albuterol  (PROAIR  HFA, VENTOLIN  HFA, OR PROVENTIL  HFA) 90 mcg/actuation inhaler Inhale two puffs by mouth into the lungs every 6 hours as needed for Wheezing or Shortness of Breath. Shake well before use.    albuterol -ipratropium (DUONEB) 0.5 mg-3 mg(2.5 mg base)/3 mL nebulizer solution Inhale 3 mL solution by nebulizer as directed four times daily.    allopurinoL (ZYLOPRIM) 300 mg tablet Take one tablet by mouth daily.    ARNUITY ELLIPTA 100 mcg/actuation disk inhaler Inhale one puff by mouth into the lungs daily.    aspirin  EC 81 mg tablet Take 1 tablet by mouth daily. Take with food.    BROVANA 15 mcg/2 mL nebulizer solution Inhale  solution by nebulizer as directed twice daily.    budesonide (PULMICORT) 1 mg/2 mL nebulizer solution Inhale  solution by nebulizer as directed twice daily.    carvediloL  (COREG ) 12.5 mg tablet Take one tablet by mouth twice daily.    Cholecalciferol (VITAMIN D3) 125 mcg (5,000 unit) capsule Take one capsule by mouth daily.    diclofenac sodium 2 % sopk Apply  topically to affected area as Needed.    duloxetine DR (CYMBALTA) 60  mg capsule Take one capsule by mouth daily.    gabapentin  (NEURONTIN ) 300 mg capsule Take one capsule by mouth every 8 hours.    HYDROcodone/acetaminophen  (NORCO) 7.5/325 mg tablet Take one tablet by mouth every 8 hours as needed for Pain.    MULTIVITAMIN PO Take 1 tablet by mouth daily.    nitroglycerin  (NITROSTAT ) 0.4 mg tablet Take 1 tablet sublingually PRN for CP, take 3 thablets 5 min appart, if pain does not go away then call 911.    olmesartan  (BENICAR ) 20 mg tablet TAKE 1 TABLET BY MOUTH TWICE A DAY (Patient taking differently: Take one tablet by mouth daily.)    omeprazole DR(+) (PRILOSEC) 40 mg capsule Take one capsule by mouth daily.    potassium chloride (K-TAB) 20 mEq tablet Take 10 mEq by mouth daily.    simvastatin  (ZOCOR ) 40 mg tablet TAKE 1 TABLET BY MOUTH EVERYDAY AT BEDTIME    tamsulosin (FLOMAX) 0.4 mg capsule Take one capsule by mouth daily.    torsemide (DEMADEX) 20 mg tablet Take one tablet by mouth daily.    triamcinolone acetonide (KENALOG) 0.1 % topical cream Apply  topically to affected area twice daily.                 [1]   Social History  Tobacco Use   Smoking Status Former    Current packs/day: 0.00    Types: Cigarettes    Quit date: 05/27/2002    Years since quitting: 22.2   Smokeless Tobacco Never

## 2024-09-17 ENCOUNTER — Encounter: Admit: 2024-09-17 | Discharge: 2024-09-17 | Payer: MEDICARE
